# Patient Record
Sex: Female | Born: 1970 | Race: White | Hispanic: No | Marital: Married | Smoking: Never smoker
Health system: Southern US, Community
[De-identification: ages and names within clinical notes are randomized; demographics above are authoritative.]

## PROBLEM LIST (undated history)

## (undated) DIAGNOSIS — E7800 Pure hypercholesterolemia, unspecified: Secondary | ICD-10-CM

## (undated) DIAGNOSIS — E119 Type 2 diabetes mellitus without complications: Secondary | ICD-10-CM

## (undated) DIAGNOSIS — N959 Unspecified menopausal and perimenopausal disorder: Secondary | ICD-10-CM

## (undated) HISTORY — PX: ABDOMINAL HYSTERECTOMY: SHX81

## (undated) HISTORY — PX: TONSILLECTOMY: SUR1361

## (undated) HISTORY — PX: LEG TENDON SURGERY: SHX1004

## (undated) HISTORY — PX: ADENOIDECTOMY: SUR15

---

## 2019-12-10 DIAGNOSIS — R110 Nausea: Secondary | ICD-10-CM | POA: Diagnosis not present

## 2019-12-10 DIAGNOSIS — E119 Type 2 diabetes mellitus without complications: Secondary | ICD-10-CM | POA: Diagnosis not present

## 2019-12-10 DIAGNOSIS — E1165 Type 2 diabetes mellitus with hyperglycemia: Secondary | ICD-10-CM | POA: Diagnosis not present

## 2019-12-10 DIAGNOSIS — R519 Headache, unspecified: Secondary | ICD-10-CM | POA: Diagnosis not present

## 2019-12-10 DIAGNOSIS — Z6837 Body mass index (BMI) 37.0-37.9, adult: Secondary | ICD-10-CM | POA: Diagnosis not present

## 2019-12-25 DIAGNOSIS — E119 Type 2 diabetes mellitus without complications: Secondary | ICD-10-CM | POA: Diagnosis not present

## 2019-12-25 DIAGNOSIS — E1165 Type 2 diabetes mellitus with hyperglycemia: Secondary | ICD-10-CM | POA: Diagnosis not present

## 2019-12-25 DIAGNOSIS — Z794 Long term (current) use of insulin: Secondary | ICD-10-CM | POA: Diagnosis not present

## 2019-12-30 DIAGNOSIS — E1165 Type 2 diabetes mellitus with hyperglycemia: Secondary | ICD-10-CM | POA: Diagnosis not present

## 2019-12-30 DIAGNOSIS — E669 Obesity, unspecified: Secondary | ICD-10-CM | POA: Diagnosis not present

## 2019-12-30 DIAGNOSIS — E785 Hyperlipidemia, unspecified: Secondary | ICD-10-CM | POA: Diagnosis not present

## 2020-01-01 DIAGNOSIS — N83201 Unspecified ovarian cyst, right side: Secondary | ICD-10-CM | POA: Diagnosis not present

## 2020-01-01 DIAGNOSIS — Z794 Long term (current) use of insulin: Secondary | ICD-10-CM | POA: Diagnosis not present

## 2020-01-01 DIAGNOSIS — G4733 Obstructive sleep apnea (adult) (pediatric): Secondary | ICD-10-CM | POA: Diagnosis not present

## 2020-01-01 DIAGNOSIS — K859 Acute pancreatitis without necrosis or infection, unspecified: Secondary | ICD-10-CM | POA: Diagnosis not present

## 2020-01-01 DIAGNOSIS — K8020 Calculus of gallbladder without cholecystitis without obstruction: Secondary | ICD-10-CM | POA: Diagnosis not present

## 2020-01-01 DIAGNOSIS — E876 Hypokalemia: Secondary | ICD-10-CM | POA: Diagnosis not present

## 2020-01-01 DIAGNOSIS — J45909 Unspecified asthma, uncomplicated: Secondary | ICD-10-CM | POA: Diagnosis not present

## 2020-01-01 DIAGNOSIS — E119 Type 2 diabetes mellitus without complications: Secondary | ICD-10-CM | POA: Diagnosis not present

## 2020-01-01 DIAGNOSIS — Z20822 Contact with and (suspected) exposure to covid-19: Secondary | ICD-10-CM | POA: Diagnosis not present

## 2020-01-01 DIAGNOSIS — K5900 Constipation, unspecified: Secondary | ICD-10-CM | POA: Diagnosis not present

## 2020-01-01 DIAGNOSIS — Z6836 Body mass index (BMI) 36.0-36.9, adult: Secondary | ICD-10-CM | POA: Diagnosis not present

## 2020-01-01 DIAGNOSIS — E669 Obesity, unspecified: Secondary | ICD-10-CM | POA: Diagnosis not present

## 2020-01-01 DIAGNOSIS — N888 Other specified noninflammatory disorders of cervix uteri: Secondary | ICD-10-CM | POA: Diagnosis not present

## 2020-01-01 DIAGNOSIS — R1011 Right upper quadrant pain: Secondary | ICD-10-CM | POA: Diagnosis not present

## 2020-01-01 DIAGNOSIS — K654 Sclerosing mesenteritis: Secondary | ICD-10-CM | POA: Diagnosis not present

## 2020-01-01 DIAGNOSIS — Z87891 Personal history of nicotine dependence: Secondary | ICD-10-CM | POA: Diagnosis not present

## 2020-01-01 DIAGNOSIS — Z23 Encounter for immunization: Secondary | ICD-10-CM | POA: Diagnosis not present

## 2020-01-01 DIAGNOSIS — Z8249 Family history of ischemic heart disease and other diseases of the circulatory system: Secondary | ICD-10-CM | POA: Diagnosis not present

## 2020-01-02 DIAGNOSIS — E876 Hypokalemia: Secondary | ICD-10-CM | POA: Diagnosis not present

## 2020-01-02 DIAGNOSIS — I499 Cardiac arrhythmia, unspecified: Secondary | ICD-10-CM | POA: Diagnosis not present

## 2020-01-02 DIAGNOSIS — K8020 Calculus of gallbladder without cholecystitis without obstruction: Secondary | ICD-10-CM | POA: Diagnosis not present

## 2020-01-02 DIAGNOSIS — K859 Acute pancreatitis without necrosis or infection, unspecified: Secondary | ICD-10-CM | POA: Diagnosis not present

## 2020-01-02 DIAGNOSIS — E669 Obesity, unspecified: Secondary | ICD-10-CM | POA: Diagnosis not present

## 2020-01-02 DIAGNOSIS — E119 Type 2 diabetes mellitus without complications: Secondary | ICD-10-CM | POA: Diagnosis not present

## 2020-01-06 DIAGNOSIS — E876 Hypokalemia: Secondary | ICD-10-CM | POA: Diagnosis not present

## 2020-01-06 DIAGNOSIS — R748 Abnormal levels of other serum enzymes: Secondary | ICD-10-CM | POA: Diagnosis not present

## 2020-01-06 DIAGNOSIS — M793 Panniculitis, unspecified: Secondary | ICD-10-CM | POA: Diagnosis not present

## 2020-01-06 DIAGNOSIS — K824 Cholesterolosis of gallbladder: Secondary | ICD-10-CM | POA: Diagnosis not present

## 2020-01-14 DIAGNOSIS — E876 Hypokalemia: Secondary | ICD-10-CM | POA: Diagnosis not present

## 2020-01-14 DIAGNOSIS — E119 Type 2 diabetes mellitus without complications: Secondary | ICD-10-CM | POA: Diagnosis not present

## 2020-02-05 DIAGNOSIS — G4733 Obstructive sleep apnea (adult) (pediatric): Secondary | ICD-10-CM | POA: Diagnosis not present

## 2020-02-10 DIAGNOSIS — J4520 Mild intermittent asthma, uncomplicated: Secondary | ICD-10-CM | POA: Diagnosis not present

## 2020-02-10 DIAGNOSIS — J069 Acute upper respiratory infection, unspecified: Secondary | ICD-10-CM | POA: Diagnosis not present

## 2020-02-10 DIAGNOSIS — J3089 Other allergic rhinitis: Secondary | ICD-10-CM | POA: Diagnosis not present

## 2020-02-10 DIAGNOSIS — R051 Acute cough: Secondary | ICD-10-CM | POA: Diagnosis not present

## 2020-02-12 DIAGNOSIS — Z20822 Contact with and (suspected) exposure to covid-19: Secondary | ICD-10-CM | POA: Diagnosis not present

## 2020-02-12 DIAGNOSIS — J189 Pneumonia, unspecified organism: Secondary | ICD-10-CM | POA: Diagnosis not present

## 2020-02-12 DIAGNOSIS — R0602 Shortness of breath: Secondary | ICD-10-CM | POA: Diagnosis not present

## 2020-02-12 DIAGNOSIS — R059 Cough, unspecified: Secondary | ICD-10-CM | POA: Diagnosis not present

## 2020-03-14 DIAGNOSIS — R110 Nausea: Secondary | ICD-10-CM | POA: Diagnosis not present

## 2020-03-14 DIAGNOSIS — R059 Cough, unspecified: Secondary | ICD-10-CM | POA: Diagnosis not present

## 2020-03-14 DIAGNOSIS — U071 COVID-19: Secondary | ICD-10-CM | POA: Diagnosis not present

## 2020-03-16 DIAGNOSIS — J4521 Mild intermittent asthma with (acute) exacerbation: Secondary | ICD-10-CM | POA: Diagnosis not present

## 2020-03-16 DIAGNOSIS — J069 Acute upper respiratory infection, unspecified: Secondary | ICD-10-CM | POA: Diagnosis not present

## 2020-03-16 DIAGNOSIS — U071 COVID-19: Secondary | ICD-10-CM | POA: Diagnosis not present

## 2020-03-16 DIAGNOSIS — J208 Acute bronchitis due to other specified organisms: Secondary | ICD-10-CM | POA: Diagnosis not present

## 2020-03-17 DIAGNOSIS — G4730 Sleep apnea, unspecified: Secondary | ICD-10-CM | POA: Diagnosis not present

## 2020-03-17 DIAGNOSIS — U071 COVID-19: Secondary | ICD-10-CM | POA: Diagnosis not present

## 2020-03-18 DIAGNOSIS — J4521 Mild intermittent asthma with (acute) exacerbation: Secondary | ICD-10-CM | POA: Diagnosis not present

## 2020-03-18 DIAGNOSIS — J208 Acute bronchitis due to other specified organisms: Secondary | ICD-10-CM | POA: Diagnosis not present

## 2020-03-18 DIAGNOSIS — J069 Acute upper respiratory infection, unspecified: Secondary | ICD-10-CM | POA: Diagnosis not present

## 2020-03-18 DIAGNOSIS — U071 COVID-19: Secondary | ICD-10-CM | POA: Diagnosis not present

## 2020-03-20 DIAGNOSIS — J208 Acute bronchitis due to other specified organisms: Secondary | ICD-10-CM | POA: Diagnosis not present

## 2020-03-20 DIAGNOSIS — U071 COVID-19: Secondary | ICD-10-CM | POA: Diagnosis not present

## 2020-03-20 DIAGNOSIS — J4521 Mild intermittent asthma with (acute) exacerbation: Secondary | ICD-10-CM | POA: Diagnosis not present

## 2020-03-20 DIAGNOSIS — J069 Acute upper respiratory infection, unspecified: Secondary | ICD-10-CM | POA: Diagnosis not present

## 2020-03-23 DIAGNOSIS — E1165 Type 2 diabetes mellitus with hyperglycemia: Secondary | ICD-10-CM | POA: Diagnosis not present

## 2020-03-23 DIAGNOSIS — J4521 Mild intermittent asthma with (acute) exacerbation: Secondary | ICD-10-CM | POA: Diagnosis not present

## 2020-03-23 DIAGNOSIS — U071 COVID-19: Secondary | ICD-10-CM | POA: Diagnosis not present

## 2020-03-23 DIAGNOSIS — J208 Acute bronchitis due to other specified organisms: Secondary | ICD-10-CM | POA: Diagnosis not present

## 2020-03-24 DIAGNOSIS — R059 Cough, unspecified: Secondary | ICD-10-CM | POA: Diagnosis not present

## 2020-03-24 DIAGNOSIS — J1282 Pneumonia due to coronavirus disease 2019: Secondary | ICD-10-CM | POA: Diagnosis not present

## 2020-03-24 DIAGNOSIS — R918 Other nonspecific abnormal finding of lung field: Secondary | ICD-10-CM | POA: Diagnosis not present

## 2020-03-24 DIAGNOSIS — U071 COVID-19: Secondary | ICD-10-CM | POA: Diagnosis not present

## 2020-03-24 DIAGNOSIS — E119 Type 2 diabetes mellitus without complications: Secondary | ICD-10-CM | POA: Diagnosis not present

## 2020-03-24 DIAGNOSIS — R0602 Shortness of breath: Secondary | ICD-10-CM | POA: Diagnosis not present

## 2020-03-24 DIAGNOSIS — J45909 Unspecified asthma, uncomplicated: Secondary | ICD-10-CM | POA: Diagnosis not present

## 2020-03-24 DIAGNOSIS — Z794 Long term (current) use of insulin: Secondary | ICD-10-CM | POA: Diagnosis not present

## 2020-03-24 DIAGNOSIS — Z79899 Other long term (current) drug therapy: Secondary | ICD-10-CM | POA: Diagnosis not present

## 2020-03-24 DIAGNOSIS — Z87891 Personal history of nicotine dependence: Secondary | ICD-10-CM | POA: Diagnosis not present

## 2020-03-24 DIAGNOSIS — G4730 Sleep apnea, unspecified: Secondary | ICD-10-CM | POA: Diagnosis not present

## 2020-03-25 DIAGNOSIS — J1281 Pneumonia due to SARS-associated coronavirus: Secondary | ICD-10-CM | POA: Diagnosis not present

## 2020-03-25 DIAGNOSIS — J4521 Mild intermittent asthma with (acute) exacerbation: Secondary | ICD-10-CM | POA: Diagnosis not present

## 2020-03-25 DIAGNOSIS — J069 Acute upper respiratory infection, unspecified: Secondary | ICD-10-CM | POA: Diagnosis not present

## 2020-03-25 DIAGNOSIS — E1165 Type 2 diabetes mellitus with hyperglycemia: Secondary | ICD-10-CM | POA: Diagnosis not present

## 2020-03-26 DIAGNOSIS — J4521 Mild intermittent asthma with (acute) exacerbation: Secondary | ICD-10-CM | POA: Diagnosis not present

## 2020-03-26 DIAGNOSIS — E1165 Type 2 diabetes mellitus with hyperglycemia: Secondary | ICD-10-CM | POA: Diagnosis not present

## 2020-03-26 DIAGNOSIS — J1281 Pneumonia due to SARS-associated coronavirus: Secondary | ICD-10-CM | POA: Diagnosis not present

## 2020-03-26 DIAGNOSIS — J069 Acute upper respiratory infection, unspecified: Secondary | ICD-10-CM | POA: Diagnosis not present

## 2020-03-30 DIAGNOSIS — J019 Acute sinusitis, unspecified: Secondary | ICD-10-CM | POA: Diagnosis not present

## 2020-03-30 DIAGNOSIS — J4521 Mild intermittent asthma with (acute) exacerbation: Secondary | ICD-10-CM | POA: Diagnosis not present

## 2020-03-30 DIAGNOSIS — E1165 Type 2 diabetes mellitus with hyperglycemia: Secondary | ICD-10-CM | POA: Diagnosis not present

## 2020-03-30 DIAGNOSIS — J1281 Pneumonia due to SARS-associated coronavirus: Secondary | ICD-10-CM | POA: Diagnosis not present

## 2020-04-17 DIAGNOSIS — G4730 Sleep apnea, unspecified: Secondary | ICD-10-CM | POA: Diagnosis not present

## 2020-04-17 DIAGNOSIS — U071 COVID-19: Secondary | ICD-10-CM | POA: Diagnosis not present

## 2020-05-06 DIAGNOSIS — N952 Postmenopausal atrophic vaginitis: Secondary | ICD-10-CM | POA: Diagnosis not present

## 2020-05-06 DIAGNOSIS — N951 Menopausal and female climacteric states: Secondary | ICD-10-CM | POA: Diagnosis not present

## 2020-05-06 DIAGNOSIS — Z01411 Encounter for gynecological examination (general) (routine) with abnormal findings: Secondary | ICD-10-CM | POA: Diagnosis not present

## 2020-05-06 DIAGNOSIS — Z1231 Encounter for screening mammogram for malignant neoplasm of breast: Secondary | ICD-10-CM | POA: Diagnosis not present

## 2020-05-06 DIAGNOSIS — Z01419 Encounter for gynecological examination (general) (routine) without abnormal findings: Secondary | ICD-10-CM | POA: Diagnosis not present

## 2020-05-15 DIAGNOSIS — G4730 Sleep apnea, unspecified: Secondary | ICD-10-CM | POA: Diagnosis not present

## 2020-05-15 DIAGNOSIS — U071 COVID-19: Secondary | ICD-10-CM | POA: Diagnosis not present

## 2020-05-19 DIAGNOSIS — Z8616 Personal history of COVID-19: Secondary | ICD-10-CM | POA: Diagnosis not present

## 2020-05-19 DIAGNOSIS — E7849 Other hyperlipidemia: Secondary | ICD-10-CM | POA: Diagnosis not present

## 2020-05-19 DIAGNOSIS — E119 Type 2 diabetes mellitus without complications: Secondary | ICD-10-CM | POA: Diagnosis not present

## 2020-05-20 DIAGNOSIS — N9489 Other specified conditions associated with female genital organs and menstrual cycle: Secondary | ICD-10-CM | POA: Diagnosis not present

## 2020-05-25 DIAGNOSIS — Z01419 Encounter for gynecological examination (general) (routine) without abnormal findings: Secondary | ICD-10-CM | POA: Diagnosis not present

## 2020-05-25 DIAGNOSIS — Z1231 Encounter for screening mammogram for malignant neoplasm of breast: Secondary | ICD-10-CM | POA: Diagnosis not present

## 2020-05-26 DIAGNOSIS — E1165 Type 2 diabetes mellitus with hyperglycemia: Secondary | ICD-10-CM | POA: Diagnosis not present

## 2020-05-26 DIAGNOSIS — E669 Obesity, unspecified: Secondary | ICD-10-CM | POA: Diagnosis not present

## 2020-05-26 DIAGNOSIS — G4733 Obstructive sleep apnea (adult) (pediatric): Secondary | ICD-10-CM | POA: Diagnosis not present

## 2020-05-26 DIAGNOSIS — E785 Hyperlipidemia, unspecified: Secondary | ICD-10-CM | POA: Diagnosis not present

## 2020-06-01 DIAGNOSIS — L400 Psoriasis vulgaris: Secondary | ICD-10-CM | POA: Diagnosis not present

## 2020-06-03 DIAGNOSIS — L280 Lichen simplex chronicus: Secondary | ICD-10-CM | POA: Diagnosis not present

## 2020-06-10 DIAGNOSIS — R928 Other abnormal and inconclusive findings on diagnostic imaging of breast: Secondary | ICD-10-CM | POA: Diagnosis not present

## 2020-06-10 DIAGNOSIS — N6489 Other specified disorders of breast: Secondary | ICD-10-CM | POA: Diagnosis not present

## 2020-06-24 DIAGNOSIS — E119 Type 2 diabetes mellitus without complications: Secondary | ICD-10-CM | POA: Diagnosis not present

## 2020-06-24 DIAGNOSIS — Z794 Long term (current) use of insulin: Secondary | ICD-10-CM | POA: Diagnosis not present

## 2020-06-29 DIAGNOSIS — J4521 Mild intermittent asthma with (acute) exacerbation: Secondary | ICD-10-CM | POA: Diagnosis not present

## 2020-06-29 DIAGNOSIS — J209 Acute bronchitis, unspecified: Secondary | ICD-10-CM | POA: Diagnosis not present

## 2020-06-29 DIAGNOSIS — E119 Type 2 diabetes mellitus without complications: Secondary | ICD-10-CM | POA: Diagnosis not present

## 2020-06-29 DIAGNOSIS — J069 Acute upper respiratory infection, unspecified: Secondary | ICD-10-CM | POA: Diagnosis not present

## 2020-06-29 DIAGNOSIS — E7849 Other hyperlipidemia: Secondary | ICD-10-CM | POA: Diagnosis not present

## 2020-06-29 DIAGNOSIS — R0989 Other specified symptoms and signs involving the circulatory and respiratory systems: Secondary | ICD-10-CM | POA: Diagnosis not present

## 2020-07-21 DIAGNOSIS — M9902 Segmental and somatic dysfunction of thoracic region: Secondary | ICD-10-CM | POA: Diagnosis not present

## 2020-07-21 DIAGNOSIS — M542 Cervicalgia: Secondary | ICD-10-CM | POA: Diagnosis not present

## 2020-07-21 DIAGNOSIS — R600 Localized edema: Secondary | ICD-10-CM | POA: Diagnosis not present

## 2020-07-21 DIAGNOSIS — M9901 Segmental and somatic dysfunction of cervical region: Secondary | ICD-10-CM | POA: Diagnosis not present

## 2020-07-24 DIAGNOSIS — R600 Localized edema: Secondary | ICD-10-CM | POA: Diagnosis not present

## 2020-07-24 DIAGNOSIS — M9901 Segmental and somatic dysfunction of cervical region: Secondary | ICD-10-CM | POA: Diagnosis not present

## 2020-07-24 DIAGNOSIS — M9902 Segmental and somatic dysfunction of thoracic region: Secondary | ICD-10-CM | POA: Diagnosis not present

## 2020-07-24 DIAGNOSIS — M542 Cervicalgia: Secondary | ICD-10-CM | POA: Diagnosis not present

## 2020-07-27 DIAGNOSIS — M62412 Contracture of muscle, left shoulder: Secondary | ICD-10-CM | POA: Diagnosis not present

## 2020-07-27 DIAGNOSIS — M9901 Segmental and somatic dysfunction of cervical region: Secondary | ICD-10-CM | POA: Diagnosis not present

## 2020-07-27 DIAGNOSIS — M9902 Segmental and somatic dysfunction of thoracic region: Secondary | ICD-10-CM | POA: Diagnosis not present

## 2020-07-27 DIAGNOSIS — M542 Cervicalgia: Secondary | ICD-10-CM | POA: Diagnosis not present

## 2020-07-31 DIAGNOSIS — M9902 Segmental and somatic dysfunction of thoracic region: Secondary | ICD-10-CM | POA: Diagnosis not present

## 2020-07-31 DIAGNOSIS — M542 Cervicalgia: Secondary | ICD-10-CM | POA: Diagnosis not present

## 2020-07-31 DIAGNOSIS — M9901 Segmental and somatic dysfunction of cervical region: Secondary | ICD-10-CM | POA: Diagnosis not present

## 2020-07-31 DIAGNOSIS — M62412 Contracture of muscle, left shoulder: Secondary | ICD-10-CM | POA: Diagnosis not present

## 2020-08-04 DIAGNOSIS — M9901 Segmental and somatic dysfunction of cervical region: Secondary | ICD-10-CM | POA: Diagnosis not present

## 2020-08-04 DIAGNOSIS — M9902 Segmental and somatic dysfunction of thoracic region: Secondary | ICD-10-CM | POA: Diagnosis not present

## 2020-08-04 DIAGNOSIS — M62412 Contracture of muscle, left shoulder: Secondary | ICD-10-CM | POA: Diagnosis not present

## 2020-08-04 DIAGNOSIS — M542 Cervicalgia: Secondary | ICD-10-CM | POA: Diagnosis not present

## 2020-08-07 DIAGNOSIS — M542 Cervicalgia: Secondary | ICD-10-CM | POA: Diagnosis not present

## 2020-08-07 DIAGNOSIS — M9901 Segmental and somatic dysfunction of cervical region: Secondary | ICD-10-CM | POA: Diagnosis not present

## 2020-08-07 DIAGNOSIS — M9902 Segmental and somatic dysfunction of thoracic region: Secondary | ICD-10-CM | POA: Diagnosis not present

## 2020-08-07 DIAGNOSIS — M62412 Contracture of muscle, left shoulder: Secondary | ICD-10-CM | POA: Diagnosis not present

## 2020-08-10 DIAGNOSIS — M9901 Segmental and somatic dysfunction of cervical region: Secondary | ICD-10-CM | POA: Diagnosis not present

## 2020-08-10 DIAGNOSIS — M542 Cervicalgia: Secondary | ICD-10-CM | POA: Diagnosis not present

## 2020-08-10 DIAGNOSIS — M62412 Contracture of muscle, left shoulder: Secondary | ICD-10-CM | POA: Diagnosis not present

## 2020-08-10 DIAGNOSIS — M9902 Segmental and somatic dysfunction of thoracic region: Secondary | ICD-10-CM | POA: Diagnosis not present

## 2020-08-11 DIAGNOSIS — M542 Cervicalgia: Secondary | ICD-10-CM | POA: Diagnosis not present

## 2020-08-11 DIAGNOSIS — M62412 Contracture of muscle, left shoulder: Secondary | ICD-10-CM | POA: Diagnosis not present

## 2020-08-11 DIAGNOSIS — M9902 Segmental and somatic dysfunction of thoracic region: Secondary | ICD-10-CM | POA: Diagnosis not present

## 2020-08-11 DIAGNOSIS — M9901 Segmental and somatic dysfunction of cervical region: Secondary | ICD-10-CM | POA: Diagnosis not present

## 2020-08-11 DIAGNOSIS — J1281 Pneumonia due to SARS-associated coronavirus: Secondary | ICD-10-CM | POA: Diagnosis not present

## 2020-08-13 DIAGNOSIS — J4520 Mild intermittent asthma, uncomplicated: Secondary | ICD-10-CM | POA: Diagnosis not present

## 2020-08-13 DIAGNOSIS — J984 Other disorders of lung: Secondary | ICD-10-CM | POA: Diagnosis not present

## 2020-08-13 DIAGNOSIS — Z6835 Body mass index (BMI) 35.0-35.9, adult: Secondary | ICD-10-CM | POA: Diagnosis not present

## 2020-08-14 DIAGNOSIS — M9901 Segmental and somatic dysfunction of cervical region: Secondary | ICD-10-CM | POA: Diagnosis not present

## 2020-08-14 DIAGNOSIS — M62412 Contracture of muscle, left shoulder: Secondary | ICD-10-CM | POA: Diagnosis not present

## 2020-08-14 DIAGNOSIS — M542 Cervicalgia: Secondary | ICD-10-CM | POA: Diagnosis not present

## 2020-08-14 DIAGNOSIS — M9902 Segmental and somatic dysfunction of thoracic region: Secondary | ICD-10-CM | POA: Diagnosis not present

## 2020-08-17 DIAGNOSIS — M9902 Segmental and somatic dysfunction of thoracic region: Secondary | ICD-10-CM | POA: Diagnosis not present

## 2020-08-17 DIAGNOSIS — M62412 Contracture of muscle, left shoulder: Secondary | ICD-10-CM | POA: Diagnosis not present

## 2020-08-17 DIAGNOSIS — M542 Cervicalgia: Secondary | ICD-10-CM | POA: Diagnosis not present

## 2020-08-17 DIAGNOSIS — M9901 Segmental and somatic dysfunction of cervical region: Secondary | ICD-10-CM | POA: Diagnosis not present

## 2020-08-18 DIAGNOSIS — M542 Cervicalgia: Secondary | ICD-10-CM | POA: Diagnosis not present

## 2020-08-18 DIAGNOSIS — M9901 Segmental and somatic dysfunction of cervical region: Secondary | ICD-10-CM | POA: Diagnosis not present

## 2020-08-18 DIAGNOSIS — M62412 Contracture of muscle, left shoulder: Secondary | ICD-10-CM | POA: Diagnosis not present

## 2020-08-18 DIAGNOSIS — M9902 Segmental and somatic dysfunction of thoracic region: Secondary | ICD-10-CM | POA: Diagnosis not present

## 2020-08-21 DIAGNOSIS — M9901 Segmental and somatic dysfunction of cervical region: Secondary | ICD-10-CM | POA: Diagnosis not present

## 2020-08-21 DIAGNOSIS — K449 Diaphragmatic hernia without obstruction or gangrene: Secondary | ICD-10-CM | POA: Diagnosis not present

## 2020-08-21 DIAGNOSIS — M62412 Contracture of muscle, left shoulder: Secondary | ICD-10-CM | POA: Diagnosis not present

## 2020-08-21 DIAGNOSIS — J3089 Other allergic rhinitis: Secondary | ICD-10-CM | POA: Diagnosis not present

## 2020-08-21 DIAGNOSIS — J4520 Mild intermittent asthma, uncomplicated: Secondary | ICD-10-CM | POA: Diagnosis not present

## 2020-08-21 DIAGNOSIS — M9902 Segmental and somatic dysfunction of thoracic region: Secondary | ICD-10-CM | POA: Diagnosis not present

## 2020-08-21 DIAGNOSIS — F17200 Nicotine dependence, unspecified, uncomplicated: Secondary | ICD-10-CM | POA: Diagnosis not present

## 2020-08-21 DIAGNOSIS — J8410 Pulmonary fibrosis, unspecified: Secondary | ICD-10-CM | POA: Diagnosis not present

## 2020-08-21 DIAGNOSIS — H1131 Conjunctival hemorrhage, right eye: Secondary | ICD-10-CM | POA: Diagnosis not present

## 2020-08-21 DIAGNOSIS — M542 Cervicalgia: Secondary | ICD-10-CM | POA: Diagnosis not present

## 2020-09-01 DIAGNOSIS — G4733 Obstructive sleep apnea (adult) (pediatric): Secondary | ICD-10-CM | POA: Diagnosis not present

## 2020-09-01 DIAGNOSIS — E785 Hyperlipidemia, unspecified: Secondary | ICD-10-CM | POA: Diagnosis not present

## 2020-09-01 DIAGNOSIS — E1165 Type 2 diabetes mellitus with hyperglycemia: Secondary | ICD-10-CM | POA: Diagnosis not present

## 2020-09-01 DIAGNOSIS — E669 Obesity, unspecified: Secondary | ICD-10-CM | POA: Diagnosis not present

## 2020-09-21 DIAGNOSIS — E119 Type 2 diabetes mellitus without complications: Secondary | ICD-10-CM | POA: Diagnosis not present

## 2020-09-21 DIAGNOSIS — Z6836 Body mass index (BMI) 36.0-36.9, adult: Secondary | ICD-10-CM | POA: Diagnosis not present

## 2020-09-22 DIAGNOSIS — Z794 Long term (current) use of insulin: Secondary | ICD-10-CM | POA: Diagnosis not present

## 2020-09-22 DIAGNOSIS — E119 Type 2 diabetes mellitus without complications: Secondary | ICD-10-CM | POA: Diagnosis not present

## 2020-09-30 DIAGNOSIS — E119 Type 2 diabetes mellitus without complications: Secondary | ICD-10-CM | POA: Diagnosis not present

## 2020-09-30 DIAGNOSIS — E7849 Other hyperlipidemia: Secondary | ICD-10-CM | POA: Diagnosis not present

## 2020-09-30 DIAGNOSIS — Z6835 Body mass index (BMI) 35.0-35.9, adult: Secondary | ICD-10-CM | POA: Diagnosis not present

## 2020-12-23 DIAGNOSIS — Z794 Long term (current) use of insulin: Secondary | ICD-10-CM | POA: Diagnosis not present

## 2020-12-23 DIAGNOSIS — E119 Type 2 diabetes mellitus without complications: Secondary | ICD-10-CM | POA: Diagnosis not present

## 2021-01-05 DIAGNOSIS — N898 Other specified noninflammatory disorders of vagina: Secondary | ICD-10-CM | POA: Diagnosis not present

## 2021-01-05 DIAGNOSIS — F419 Anxiety disorder, unspecified: Secondary | ICD-10-CM | POA: Diagnosis not present

## 2021-01-05 DIAGNOSIS — N951 Menopausal and female climacteric states: Secondary | ICD-10-CM | POA: Diagnosis not present

## 2021-01-05 DIAGNOSIS — Z79890 Hormone replacement therapy (postmenopausal): Secondary | ICD-10-CM | POA: Diagnosis not present

## 2021-01-05 DIAGNOSIS — L280 Lichen simplex chronicus: Secondary | ICD-10-CM | POA: Diagnosis not present

## 2021-01-05 DIAGNOSIS — Z23 Encounter for immunization: Secondary | ICD-10-CM | POA: Diagnosis not present

## 2021-01-05 DIAGNOSIS — R300 Dysuria: Secondary | ICD-10-CM | POA: Diagnosis not present

## 2021-01-26 DIAGNOSIS — G4733 Obstructive sleep apnea (adult) (pediatric): Secondary | ICD-10-CM | POA: Diagnosis not present

## 2021-02-16 ENCOUNTER — Ambulatory Visit (INDEPENDENT_AMBULATORY_CARE_PROVIDER_SITE_OTHER): Payer: BC Managed Care – PPO

## 2021-02-16 ENCOUNTER — Other Ambulatory Visit: Payer: Self-pay

## 2021-02-16 ENCOUNTER — Ambulatory Visit
Admission: EM | Admit: 2021-02-16 | Discharge: 2021-02-16 | Disposition: A | Discharge status: Home / Self Care | Payer: BC Managed Care – PPO

## 2021-02-16 DIAGNOSIS — J0100 Acute maxillary sinusitis, unspecified: Secondary | ICD-10-CM

## 2021-02-16 DIAGNOSIS — R0989 Other specified symptoms and signs involving the circulatory and respiratory systems: Secondary | ICD-10-CM | POA: Diagnosis not present

## 2021-02-16 DIAGNOSIS — R059 Cough, unspecified: Secondary | ICD-10-CM | POA: Diagnosis not present

## 2021-02-16 DIAGNOSIS — J40 Bronchitis, not specified as acute or chronic: Secondary | ICD-10-CM

## 2021-02-16 DIAGNOSIS — R062 Wheezing: Secondary | ICD-10-CM | POA: Diagnosis not present

## 2021-02-16 HISTORY — DX: Type 2 diabetes mellitus without complications: E11.9

## 2021-02-16 HISTORY — DX: Unspecified menopausal and perimenopausal disorder: N95.9

## 2021-02-16 HISTORY — DX: Pure hypercholesterolemia, unspecified: E78.00

## 2021-02-16 MED ORDER — BENZONATATE 100 MG PO CAPS: 200.0000 mg | ORAL_CAPSULE | Freq: Three times a day (TID) | ORAL | 0 refills | Status: DC

## 2021-02-16 MED ORDER — PREDNISONE 20 MG PO TABS: 60.0000 mg | ORAL_TABLET | Freq: Every day | ORAL | 0 refills | Status: AC

## 2021-02-16 MED ORDER — GUAIFENESIN-CODEINE 100-10 MG/5ML PO SYRP: 5.0000 mL | ORAL_SOLUTION | Freq: Three times a day (TID) | ORAL | 0 refills | Status: DC | PRN

## 2021-02-16 MED ORDER — DOXYCYCLINE HYCLATE 100 MG PO CAPS: 100.0000 mg | ORAL_CAPSULE | Freq: Two times a day (BID) | ORAL | 0 refills | Status: DC

## 2021-02-16 NOTE — Discharge Instructions (Addendum)
The Doxycycline twice daily with food for 10 days for treatment of your sinusitis.  Perform sinus irrigation 2-3 times a day with a NeilMed sinus rinse kit and distilled water.  Do not use tap water.  You can use plain over-the-counter Mucinex every 6 hours to break up the stickiness of the mucus so your body can clear it.  Increase your oral fluid intake to thin out your mucus so that is also able for your body to clear more easily.  Take an over-the-counter probiotic, such as Culturelle-align-activia, 1 hour after each dose of antibiotic to prevent diarrhea.  Use the albuterol inhaler/nebulizer every 4-6 hours as needed for shortness of breath, wheezing, and cough.  Take the prednisone according to the package instructions to help with pulmonary inflammation.  Use the Tessalon Perles every 8 hours for your cough.  Taken with a small sip of water.  They may give you some numbness to the base of your tongue or metallic taste in her mouth, this is normal.  They are designed to calm down the cough reflex.  Use the Cheratussin cough syrup at bedtime as will make you drowsy.  You may take 1 teaspoon (5 mL) every 6 hours.   If you develop any new or worsening symptoms return for reevaluation or see your primary care provider.

## 2021-02-16 NOTE — ED Provider Notes (Signed)
[facility]    CSN: [id number] Arrival date & time: 02/16/21  5852      History   Chief Complaint Chief Complaint  Patient presents with   Cough   Nasal Congestion    HPI Tonya Gross is a 50 y.o. female.   HPI  50 year old female here for evaluation of respiratory symptoms.  Patient ports that she was diagnosed with influenza on 1129.  She had telehealth appointment on 12 5 and was prescribed cough medication and prednisone but she reports that she is still continuing to cough, have nasal congestions, wheezing and crackles when she breathes.  She states that she had a fever on the first 2 days of illness but has not had any subsequent fevers.  She is experiencing green nasal discharge and pressure in her sinuses.  She is producing colored sputum with streaks of blood when she coughs.  Every evening she endorses a coughing fit which results in posttussive emesis prior to bed.  She is also had occasional shortness of breath.  She is still experiencing sore throat and ear fullness.  Until yesterday she was experiencing diarrhea and now she is endorsing constipation.  She denies any nausea or vomiting.  Past Medical History:  Diagnosis Date   High cholesterol    Menopausal problem    Type 2 diabetes mellitus ([place])     There are no problems to display for this patient.   Past Surgical History:  Procedure Laterality Date   ABDOMINAL HYSTERECTOMY     ADENOIDECTOMY     LEG TENDON SURGERY     TONSILLECTOMY      OB History   No obstetric history on file.      Home Medications    Prior to Admission medications   Medication Sig Start Date End Date Taking? Authorizing Provider  benzonatate (TESSALON) 100 MG capsule Take 2 capsules (200 mg total) by mouth every 8 (eight) hours. 02/16/21  Yes Margarette Canada, NP  doxycycline (VIBRAMYCIN) 100 MG capsule Take 1 capsule (100 mg total) by mouth 2 (two) times daily. 02/16/21  Yes Margarette Canada, NP  estradiol (ESTRACE)  2 MG tablet Take 1 tablet by mouth daily. 01/05/21  Yes [provider]  guaiFENesin-codeine (ROBITUSSIN AC) 100-10 MG/5ML syrup Take 5 mLs by mouth 3 (three) times daily as needed for cough. 02/16/21  Yes Margarette Canada, NP  predniSONE (DELTASONE) 20 MG tablet Take 3 tablets (60 mg total) by mouth daily with breakfast for 5 days. 3 tablets daily for 5 days. 02/16/21 02/21/21 Yes Margarette Canada, NP  albuterol (VENTOLIN HFA) 108 (90 Base) MCG/ACT inhaler Inhale into the lungs.    [provider]  EPINEPHrine 0.3 mg/0.3 mL IJ SOAJ injection Inject into the muscle as directed. 08/21/20   [provider]  insulin aspart (NOVOLOG) 100 UNIT/ML injection Inject into the skin.    [provider]  OZEMPIC, 2 MG/DOSE, 8 MG/3ML SOPN Inject 2 mg into the skin once a week. 12/23/20   [provider]  PARoxetine (PAXIL) 20 MG tablet Take 20 mg by mouth daily. 01/06/21   [provider]  rosuvastatin (CRESTOR) 20 MG tablet Take 20 mg by mouth daily. 12/24/20   [provider]    Family History History reviewed. No pertinent family history.  Social History Social History   Tobacco Use   Smoking status: Never   Smokeless tobacco: Never  Vaping Use   Vaping Use: Never used  Substance Use Topics   Alcohol use:  Yes    Comment: social drinker   Drug use: Never     Allergies   Amoxicillin-pot clavulanate, Bee venom, Ciprofloxacin, and Ciprofloxacin hcl   Review of Systems Review of Systems  Constitutional:  Positive for fatigue and fever. Negative for activity change and appetite change.  HENT:  Positive for congestion, ear pain, rhinorrhea, sinus pressure and sore throat.   Respiratory:  Positive for cough, shortness of breath and wheezing.   Gastrointestinal:  Positive for diarrhea. Negative for abdominal pain, nausea and vomiting.  Skin:  Negative for rash.  Hematological: Negative.   Psychiatric/Behavioral: Negative.      Physical  Exam Triage Vital Signs ED Triage Vitals  Enc Vitals Group     BP 02/16/21 0917 117/87     Pulse Rate 02/16/21 0917 89     Resp 02/16/21 0917 16     Temp 02/16/21 0917 98.6 F (37 C)     Temp Source 02/16/21 0917 Oral     SpO2 02/16/21 0917 99 %     Weight 02/16/21 0915 217 lb (98.4 kg)     Height --      Head Circumference --      Peak Flow --      Pain Score 02/16/21 0909 0     Pain Loc --      Pain Edu? --      Excl. in [place]? --    No data found.  Updated Vital Signs BP 117/87 (BP Location: Right Arm)    Pulse 89    Temp 98.6 F (37 C) (Oral)    Resp 16    Wt 217 lb (98.4 kg)    SpO2 99%   Visual Acuity Right Eye Distance:   Left Eye Distance:   Bilateral Distance:    Right Eye Near:   Left Eye Near:    Bilateral Near:     Physical Exam Vitals and nursing note reviewed.  Constitutional:      General: She is not in acute distress.    Appearance: Normal appearance. She is not ill-appearing.  HENT:     Head: Normocephalic and atraumatic.     Right Ear: Tympanic membrane, ear canal and external ear normal. There is no impacted cerumen.     Left Ear: Tympanic membrane, ear canal and external ear normal. There is no impacted cerumen.     Nose: Congestion and rhinorrhea present.     Comments: Patient does have tenderness to percussion of left maxillary sinus.    Mouth/Throat:     Mouth: Mucous membranes are moist.     Pharynx: Oropharynx is clear. Posterior oropharyngeal erythema present.  Cardiovascular:     Rate and Rhythm: Normal rate and regular rhythm.     Pulses: Normal pulses.     Heart sounds: Normal heart sounds. No murmur heard.   No gallop.  Pulmonary:     Effort: Pulmonary effort is normal.     Breath sounds: Wheezing present. No rhonchi or rales.     Comments: Patient does have wheezing with coughing but not through her normal respiratory cycle. Musculoskeletal:     Cervical back: Normal range of motion and neck supple.  Lymphadenopathy:      Cervical: No cervical adenopathy.  Skin:    General: Skin is warm and dry.     Capillary Refill: Capillary refill takes less than 2 seconds.     Findings: No erythema or rash.  Neurological:     General: No focal deficit  present.     Mental Status: She is alert and oriented to person, place, and time.  Psychiatric:        Mood and Affect: Mood normal.        Behavior: Behavior normal.        Thought Content: Thought content normal.        Judgment: Judgment normal.     UC Treatments / Results  Labs (all labs ordered are listed, but only abnormal results are displayed) Labs Reviewed - No data to display  EKG   Radiology DG Chest 2 View  Result Date: 02/16/2021 CLINICAL DATA:  Cough for 2 weeks, nasal congestion, wheezing, crackles EXAM: CHEST - 2 VIEW COMPARISON:  None FINDINGS: Upper normal heart size. Mediastinal contours and pulmonary vascularity normal. Lungs clear. No pulmonary infiltrate, pleural effusion, or pneumothorax. Osseous structures unremarkable. IMPRESSION: No acute abnormalities. Electronically Signed   By: Lavonia Dana M.D.   On: 02/16/2021 10:07    Procedures Procedures (including critical care time)  Medications Ordered in UC Medications - No data to display  Initial Impression / Assessment and Plan / UC Course  I have reviewed the triage vital signs and the nursing notes.  Pertinent labs & imaging results that were available during my care of the patient were reviewed by me and considered in my medical decision making (see chart for details).  Patient is a nontoxic-appearing 50 year old female here for evaluation of continued respiratory symptoms that have been present for 14 days since being diagnosed with the flu on 02/02/2021.  She has been treated with cough medicine and prednisone without any resolution of symptoms.  She states that she is coughing so much that now she is bringing up colored mucus with streaks of blood in it.  On physical exam patient  has pearly gray tympanic membranes bilaterally with a normal light reflex and clear external auditory canals.  Nasal mucosa is erythematous and mildly edematous with clear discharge in both nares.  The left maxillary sinus is tender to percussion.  Oropharyngeal exam reveals mild posterior oropharyngeal erythema with clear postnasal drip.  No injection noted.  Patient's tonsils are surgically absent.  No cervical lymphadenopathy appreciated on exam.  Cardiopulmonary exam reveals clear lung sounds through out all lung fields.  Deep breathing does trigger a cough and when patient coughs she does have expiratory wheezing.  Due to the protracted duration of symptoms and purulent sputum production and can obtain a chest x-ray to rule out the presence of pneumonia.  Chest x-ray independently reviewed and evaluated by me.  Impression: The lung spaces are well pneumatized.  There is no evidence of effusion or infiltrate on exam.  Radiology overread is pending. Radiology impression is no acute abnormalities.  Patient's exam is consistent with maxillary sinusitis and also bronchitis.  I will treat her with doxycycline twice daily for 10 days as she has allergies to fluoroquinolones as well as Augmentin.  Both cause hives so I am reluctant to prescribe a cephalosporin.  I will also prescribe another round of prednisone, Tessalon Perles and Cheratussin cough syrup as patient has been taking Promethazine DM without any relief of symptoms.  Patient is prescribed albuterol inhaler which I will encourage her to continue using for wheezing.   Final Clinical Impressions(s) / UC Diagnoses   Final diagnoses:  Acute non-recurrent maxillary sinusitis  Bronchitis     Discharge Instructions      The Doxycycline twice daily with food for 10 days for treatment of your  sinusitis.  Perform sinus irrigation 2-3 times a day with a NeilMed sinus rinse kit and distilled water.  Do not use tap water.  You can use plain  over-the-counter Mucinex every 6 hours to break up the stickiness of the mucus so your body can clear it.  Increase your oral fluid intake to thin out your mucus so that is also able for your body to clear more easily.  Take an over-the-counter probiotic, such as Culturelle-align-activia, 1 hour after each dose of antibiotic to prevent diarrhea.  Use the albuterol inhaler/nebulizer every 4-6 hours as needed for shortness of breath, wheezing, and cough.  Take the prednisone according to the package instructions to help with pulmonary inflammation.  Use the Tessalon Perles every 8 hours for your cough.  Taken with a small sip of water.  They may give you some numbness to the base of your tongue or metallic taste in her mouth, this is normal.  They are designed to calm down the cough reflex.  Use the Cheratussin cough syrup at bedtime as will make you drowsy.  You may take 1 teaspoon (5 mL) every 6 hours.   If you develop any new or worsening symptoms return for reevaluation or see your primary care provider.      ED Prescriptions     Medication Sig Dispense Auth. Provider   doxycycline (VIBRAMYCIN) 100 MG capsule Take 1 capsule (100 mg total) by mouth 2 (two) times daily. 20 capsule Margarette Canada, NP   predniSONE (DELTASONE) 20 MG tablet Take 3 tablets (60 mg total) by mouth daily with breakfast for 5 days. 3 tablets daily for 5 days. 15 tablet Margarette Canada, NP   benzonatate (TESSALON) 100 MG capsule Take 2 capsules (200 mg total) by mouth every 8 (eight) hours. 21 capsule Margarette Canada, NP   guaiFENesin-codeine (ROBITUSSIN AC) 100-10 MG/5ML syrup Take 5 mLs by mouth 3 (three) times daily as needed for cough. 120 mL Margarette Canada, NP      I have reviewed the PDMP during this encounter.   Margarette Canada, NP 02/16/21 1019

## 2021-02-16 NOTE — ED Triage Notes (Signed)
Patient presents to Urgent Care with complaints of cough, nasal congestion, wheezing/crackles. She had telehealth appt on 12/05. Prescribed the cough med and prednisone. Pt states she had the flu 11/29. Treating symptoms with neb treatments, promethazine, mucinex. Had two negative covid tests.

## 2021-05-05 DIAGNOSIS — E119 Type 2 diabetes mellitus without complications: Secondary | ICD-10-CM | POA: Diagnosis not present

## 2021-05-19 DIAGNOSIS — G4733 Obstructive sleep apnea (adult) (pediatric): Secondary | ICD-10-CM | POA: Diagnosis not present

## 2021-06-08 DIAGNOSIS — H25813 Combined forms of age-related cataract, bilateral: Secondary | ICD-10-CM | POA: Diagnosis not present

## 2021-06-08 DIAGNOSIS — E119 Type 2 diabetes mellitus without complications: Secondary | ICD-10-CM | POA: Diagnosis not present

## 2021-06-22 DIAGNOSIS — H25812 Combined forms of age-related cataract, left eye: Secondary | ICD-10-CM | POA: Diagnosis not present

## 2021-06-22 DIAGNOSIS — Z01818 Encounter for other preprocedural examination: Secondary | ICD-10-CM | POA: Diagnosis not present

## 2021-07-13 DIAGNOSIS — Z1239 Encounter for other screening for malignant neoplasm of breast: Secondary | ICD-10-CM | POA: Diagnosis not present

## 2021-07-13 DIAGNOSIS — Z1231 Encounter for screening mammogram for malignant neoplasm of breast: Secondary | ICD-10-CM | POA: Diagnosis not present

## 2021-07-20 DIAGNOSIS — E119 Type 2 diabetes mellitus without complications: Secondary | ICD-10-CM | POA: Diagnosis not present

## 2021-07-20 DIAGNOSIS — E7849 Other hyperlipidemia: Secondary | ICD-10-CM | POA: Diagnosis not present

## 2021-07-20 DIAGNOSIS — D72819 Decreased white blood cell count, unspecified: Secondary | ICD-10-CM | POA: Diagnosis not present

## 2021-07-21 ENCOUNTER — Ambulatory Visit (INDEPENDENT_AMBULATORY_CARE_PROVIDER_SITE_OTHER): Payer: BC Managed Care – PPO | Admitting: Internal Medicine

## 2021-07-21 ENCOUNTER — Ambulatory Visit: Payer: BC Managed Care – PPO | Admitting: Internal Medicine

## 2021-07-21 ENCOUNTER — Encounter: Payer: Self-pay | Admitting: Internal Medicine

## 2021-07-21 VITALS — BP 99/65 | HR 87 | Ht 67.0 in | Wt 196.0 lb

## 2021-07-21 DIAGNOSIS — Z794 Long term (current) use of insulin: Secondary | ICD-10-CM

## 2021-07-21 DIAGNOSIS — R739 Hyperglycemia, unspecified: Secondary | ICD-10-CM

## 2021-07-21 DIAGNOSIS — E119 Type 2 diabetes mellitus without complications: Secondary | ICD-10-CM

## 2021-07-21 LAB — POCT GLYCOSYLATED HEMOGLOBIN (HGB A1C): Hemoglobin A1C: 6.5 % — AB (ref 4.0–5.6)

## 2021-07-21 MED ORDER — DEXCOM G7 SENSOR MISC: 1.0000 | 3 refills | Status: DC

## 2021-07-21 MED ORDER — CONTOUR NEXT TEST VI STRP: 1.0000 | ORAL_STRIP | Freq: Four times a day (QID) | 3 refills | Status: DC

## 2021-07-21 NOTE — Progress Notes (Signed)
Name: Tonya Gross  MRN/ DOB: [id number], 03-Mar-1971   Age/ Sex: 51 y.o., female    PCP: Wayland Denis, PA-C   Reason for Endocrinology Evaluation: Type 2 Diabetes Mellitus     Date of Initial Endocrinology Visit: 07/21/2021     PATIENT IDENTIFIER: Tonya Gross is a 51 y.o. female with a past medical history of DM, HTN, Dyslipidemia, OSA. The patient presented for initial endocrinology clinic visit on 07/21/2021 for consultative assistance with Tonya Gross diabetes management.    HPI: Tonya Gross was    Moved from [place]  , [place]   Diagnosed with DM 2007 Prior Medications tried/Intolerance: Ozempic -no intolerance , Metformin- intolerance .Marland Kitchen Has been on the pump many years ( Medtronic )  Currently checking blood sugars 3 x / day Hypoglycemia episodes : yes               Symptoms: yes                 Frequency: rare Hemoglobin A1c has ranged from 6.5% in 2023, peaking at 11.0%  yrs ago  Patient has required hospitalization within the last 1 year from hyper or hypoglycemia: no  In terms of diet, the patient eats 3 meals a day, does not always eat of CHO ( [employer] diet) she has been on it for 3 months   Pending cataract sx   Father with DM  NO Fh of thyroid disease      This patient with type 1 diabetes is treated with medtronic (insulin pump). During the visit the pump basal and bolus doses were reviewed including carb/insulin rations and supplemental doses. The clinical list was updated. The glucose meter download was reviewed in detail to determine if the current pump settings are providing the best glycemic control without excessive hypoglycemia.  Pump and meter download:    Pump   Medtronic  Settings   Insulin type   Novolog    Basal rate       0000  1.15 u/h    0300 1.20 u/h    0700 1.20 u/h    1000 1.05   1500 1.15      I:C ratio       0000  11   1100 11   1600 11          Sensitivity       0000  50      Goal       0000  90-120             Type & Model of Pump: Medtronic  Insulin Type: Currently using Novolog.  Body mass index is 30.7 kg/m.  PUMP STATISTICS: Average BG: 127 +/- 44 Average Daily Carbs (g): 8 +/- 16 Average Total Daily Insulin: 26 +/- 3.8 Average Daily Basal: 24.9 (96 %) Average Daily Bolus: 1.1 (4 %)      HOME DIABETES REGIMEN: Novolog    Statin: yes  ACE-I/ARB: no     METER DOWNLOAD SUMMARY:   DIABETIC COMPLICATIONS: Microvascular complications:   Denies: CKD, neuropathy, retinopathy  Last eye exam: Completed 06/2021  Macrovascular complications:  Denies: CAD, PVD, CVA   PAST HISTORY: Past Medical History:  Past Medical History:  Diagnosis Date   High cholesterol    Menopausal problem    Type 2 diabetes mellitus ([place])    Past Surgical History:  Past Surgical History:  Procedure Laterality Date   ABDOMINAL HYSTERECTOMY     ADENOIDECTOMY     LEG TENDON  SURGERY     TONSILLECTOMY      Social History:  reports that she has never smoked. She has never used smokeless tobacco. She reports current alcohol use. She reports that she does not use drugs. Family History: No family history on file.   HOME MEDICATIONS: Allergies as of 07/21/2021       Reactions   Amoxicillin-pot Clavulanate Hives   Bee Venom Anaphylaxis   Ciprofloxacin Hives   Ciprofloxacin Hcl Hives   Silicone Other (See Comments)   Peeling        Medication List        Accurate as of Jul 21, 2021  2:19 PM. If you have any questions, ask your nurse or doctor.          STOP taking these medications    benzonatate 100 MG capsule Commonly known as: TESSALON Stopped by: Dorita Sciara, MD   doxycycline 100 MG capsule Commonly known as: VIBRAMYCIN Stopped by: Dorita Sciara, MD   estradiol 2 MG tablet Commonly known as: ESTRACE Stopped by: Dorita Sciara, MD   guaiFENesin-codeine 100-10 MG/5ML syrup Commonly known as: ROBITUSSIN AC Stopped by: Dorita Sciara, MD   PARoxetine 20 MG tablet Commonly known as: PAXIL Stopped by: Dorita Sciara, MD       TAKE these medications    albuterol 108 (90 Base) MCG/ACT inhaler Commonly known as: VENTOLIN HFA Inhale into the lungs.   EPINEPHrine 0.3 mg/0.3 mL Soaj injection Commonly known as: EPI-PEN Inject into the muscle as directed.   insulin aspart 100 UNIT/ML injection Commonly known as: novoLOG Inject into the skin.   NON FORMULARY Blueprint metabolic H3   NON FORMULARY BLUEPRINT PROTEIN BB   NON FORMULARY BLUEPRINT CORTISOL B   NON FORMULARY BLUEPRINT SLIM DIETARY SUPPLEMENT   NON FORMULARY BLUEPRINT CLEANSE   Ozempic (2 MG/DOSE) 8 MG/3ML Sopn Generic drug: Semaglutide (2 MG/DOSE) Inject 2 mg into the skin once a week.   rosuvastatin 20 MG tablet Commonly known as: CRESTOR Take 20 mg by mouth daily.         ALLERGIES: Allergies  Allergen Reactions   Amoxicillin-Pot Clavulanate Hives   Bee Venom Anaphylaxis   Ciprofloxacin Hives   Ciprofloxacin Hcl Hives   Silicone Other (See Comments)    Peeling     REVIEW OF SYSTEMS: A comprehensive ROS was conducted with the patient and is negative except as per HPI and below:  Review of Systems  Gastrointestinal:  Negative for abdominal pain, diarrhea and nausea.     OBJECTIVE:   VITAL SIGNS: BP 99/65 (BP Location: Left Arm, Patient Position: Sitting, Cuff Size: Large)   Pulse 87   Ht 5\' 7"  (1.702 m)   Wt 196 lb (88.9 kg)   SpO2 99%   BMI 30.70 kg/m    PHYSICAL EXAM:  General: Pt appears well and is in NAD  Neck: General: Supple without adenopathy or carotid bruits. Thyroid: Thyroid size normal.  No goiter or nodules appreciated.  Lungs: Clear with good BS bilat with no rales, rhonchi, or wheezes  Heart: RRR with normal S1 and S2 and no gallops; no murmurs; no rub  Abdomen: Normoactive bowel sounds, soft, nontender, without masses or organomegaly palpable  Extremities:  Lower extremities -  No pretibial edema. No lesions.  Neuro: MS is good with appropriate affect, pt is alert and Ox3    DM foot exam: 07/21/2021  The skin of the feet is intact without sores or ulcerations. The pedal  pulses are 2+ on right and 2+ on left. The sensation is intact to a screening 5.07, 10 gram monofilament bilaterally   DATA REVIEWED:  07/20/2021 BUN/Cr 21/0.79 GFR 91 Na 137 K 4.4 AST 89 (0-40) ALT 92 (0-32) Tg 214 LDL 138 HDL 25    ASSESSMENT / PLAN / RECOMMENDATIONS:   1) Type 2 Diabetes Mellitus, Optimally controlled, Without  complications - Most recent A1c of 6.5 %. Goal A1c < 7.0 %.    - Tonya Gross A1c is at goal -Intolerant to metformin -She has been on Ozempic in the past but developed hypoglycemia, we discussed benefits of Ozempic, I did explain to the patient that Ozempic improves insulin resistance hence hypoglycemia and I would recommend reducing Tonya Gross insulin regimen while restarting on Ozempic, she has some at home and is willing to give this another try  -I have changed Tonya Gross basal rate as below and adjusted Tonya Gross sensitivity factor  -We will fax Dexcom prescription to [facility] health care Pump   Medtronic  Settings   Insulin type   Novolog    Basal rate       0000  1.00  u/h    0300 1.00 u/h    0700 1.00  u/h    1000 0.90   1500 1.00      I:C ratio       0000  11   1100 11   1600 11          Sensitivity       0000  40      Goal       0000  90-120       MEDICATIONS: NovoLog  EDUCATION / INSTRUCTIONS: BG monitoring instructions: Patient is instructed to check Tonya Gross blood sugars 3 times a day, before meals. Call [facility] if: BG persistently < 70  I reviewed the Rule of 15 for the treatment of hypoglycemia in detail with the patient. Literature supplied.   2) Diabetic complications:  Eye: Does not have known diabetic retinopathy.  Neuro/ Feet: Does not have known diabetic peripheral neuropathy. Renal: Patient does not have known baseline  CKD. She is not on an ACEI/ARB at present.     Signed electronically by: Mack Guise, MD  [facility]  [facility] [address], [place], [place] [address] Phone: [phone] FAX: [phone]   CC: Wayland Denis, PA-C [place] [address] Phone: [phone]  Fax: [phone]    Return to Endocrinology clinic as below: Future Appointments  Date Time Provider [place]  07/21/2021  2:20 PM Ernesha Ramone, Melanie Crazier, MD [facility] None

## 2021-07-21 NOTE — Patient Instructions (Signed)
Restart Ozempic 2 mg weekly  ? ? ? ?Pump   Medtronic  Settings   ?Insulin type   Novolog    ?Basal rate      ? 0000  1.00  u/h   ? 0300 1.00 u/h   ? 0700 1.00  u/h   ? 1000 0.90  ? 1500 1.00  ?    ?I:C ratio      ? 0000  11  ? 1100 11  ? 1600 11  ?    ?    ?Sensitivity      ? 0000  40  ?    ?Goal      ? 0000  90-120  ?  ?  ? ?

## 2021-07-22 ENCOUNTER — Encounter: Payer: Self-pay | Admitting: Internal Medicine

## 2021-07-23 DIAGNOSIS — E119 Type 2 diabetes mellitus without complications: Secondary | ICD-10-CM | POA: Insufficient documentation

## 2021-07-23 MED ORDER — OZEMPIC (2 MG/DOSE) 8 MG/3ML ~~LOC~~ SOPN: 2.0000 mg | PEN_INJECTOR | SUBCUTANEOUS | 1 refills | Status: DC

## 2021-07-23 MED ORDER — INSULIN ASPART 100 UNIT/ML IJ SOLN
INTRAMUSCULAR | 3 refills | Status: DC
Start: 2021-07-23 — End: 2021-09-08

## 2021-07-26 DIAGNOSIS — R945 Abnormal results of liver function studies: Secondary | ICD-10-CM | POA: Diagnosis not present

## 2021-07-26 DIAGNOSIS — D696 Thrombocytopenia, unspecified: Secondary | ICD-10-CM | POA: Diagnosis not present

## 2021-07-26 DIAGNOSIS — D72819 Decreased white blood cell count, unspecified: Secondary | ICD-10-CM | POA: Diagnosis not present

## 2021-07-26 DIAGNOSIS — E7849 Other hyperlipidemia: Secondary | ICD-10-CM | POA: Diagnosis not present

## 2021-07-29 DIAGNOSIS — H25812 Combined forms of age-related cataract, left eye: Secondary | ICD-10-CM | POA: Diagnosis not present

## 2021-07-29 DIAGNOSIS — H2512 Age-related nuclear cataract, left eye: Secondary | ICD-10-CM | POA: Diagnosis not present

## 2021-07-29 DIAGNOSIS — H52223 Regular astigmatism, bilateral: Secondary | ICD-10-CM | POA: Diagnosis not present

## 2021-08-09 ENCOUNTER — Other Ambulatory Visit (HOSPITAL_COMMUNITY): Payer: Self-pay

## 2021-08-09 ENCOUNTER — Telehealth: Payer: Self-pay | Admitting: Pharmacy Technician

## 2021-08-09 NOTE — Telephone Encounter (Signed)
Patient Advocate Encounter   Received notification from CoverMyMeds that prior authorization for Ozempic 2mg /dose is required by his/her insurance [employer].  Per Test Claim: filled 07/23/21   PA submitted on 08/09/21 Key  [id number]  Prior Authorization has been approved.    No PA letter received.

## 2021-08-12 DIAGNOSIS — H25811 Combined forms of age-related cataract, right eye: Secondary | ICD-10-CM | POA: Diagnosis not present

## 2021-08-12 DIAGNOSIS — H269 Unspecified cataract: Secondary | ICD-10-CM | POA: Diagnosis not present

## 2021-08-12 DIAGNOSIS — E119 Type 2 diabetes mellitus without complications: Secondary | ICD-10-CM | POA: Diagnosis not present

## 2021-08-12 DIAGNOSIS — H2511 Age-related nuclear cataract, right eye: Secondary | ICD-10-CM | POA: Diagnosis not present

## 2021-08-17 DIAGNOSIS — Z1211 Encounter for screening for malignant neoplasm of colon: Secondary | ICD-10-CM | POA: Diagnosis not present

## 2021-08-17 DIAGNOSIS — Z9229 Personal history of other drug therapy: Secondary | ICD-10-CM | POA: Diagnosis not present

## 2021-08-17 DIAGNOSIS — Z23 Encounter for immunization: Secondary | ICD-10-CM | POA: Diagnosis not present

## 2021-08-17 DIAGNOSIS — N952 Postmenopausal atrophic vaginitis: Secondary | ICD-10-CM | POA: Diagnosis not present

## 2021-08-17 DIAGNOSIS — Z86010 Personal history of colonic polyps: Secondary | ICD-10-CM | POA: Diagnosis not present

## 2021-09-08 ENCOUNTER — Other Ambulatory Visit: Payer: Self-pay

## 2021-09-08 MED ORDER — INSULIN ASPART 100 UNIT/ML IJ SOLN: INTRAMUSCULAR | 0 refills | Status: DC

## 2021-09-10 ENCOUNTER — Other Ambulatory Visit: Payer: Self-pay | Admitting: Internal Medicine

## 2021-09-10 ENCOUNTER — Telehealth: Payer: Self-pay

## 2021-09-10 MED ORDER — LANTUS SOLOSTAR 100 UNIT/ML ~~LOC~~ SOPN: 26.0000 [IU] | PEN_INJECTOR | Freq: Every day | SUBCUTANEOUS | 2 refills | Status: DC

## 2021-09-10 MED ORDER — NOVOLOG FLEXPEN RELION 100 UNIT/ML ~~LOC~~ SOPN: PEN_INJECTOR | SUBCUTANEOUS | 6 refills | Status: DC

## 2021-09-10 MED ORDER — INSULIN PEN NEEDLE 32G X 4 MM MISC: 1.0000 | Freq: Four times a day (QID) | 3 refills | Status: AC

## 2021-09-10 NOTE — Telephone Encounter (Signed)
Patient states that her pump has broken and she is currently out of town. Patient would like to have something sent in for her to take. Patient doesn't wont to stay on the Medtronic anymore. Patient would like  medication to be sent to [facility] in [place], [place].

## 2021-09-10 NOTE — Telephone Encounter (Signed)
Patient has been advised on changes and also sent a mychart message with info per patient.

## 2021-09-13 DIAGNOSIS — L659 Nonscarring hair loss, unspecified: Secondary | ICD-10-CM | POA: Diagnosis not present

## 2021-09-13 DIAGNOSIS — D696 Thrombocytopenia, unspecified: Secondary | ICD-10-CM | POA: Diagnosis not present

## 2021-09-13 DIAGNOSIS — D72819 Decreased white blood cell count, unspecified: Secondary | ICD-10-CM | POA: Diagnosis not present

## 2021-09-13 DIAGNOSIS — R945 Abnormal results of liver function studies: Secondary | ICD-10-CM | POA: Diagnosis not present

## 2021-09-13 DIAGNOSIS — E7849 Other hyperlipidemia: Secondary | ICD-10-CM | POA: Diagnosis not present

## 2021-09-14 DIAGNOSIS — G4733 Obstructive sleep apnea (adult) (pediatric): Secondary | ICD-10-CM | POA: Diagnosis not present

## 2021-09-17 ENCOUNTER — Encounter: Payer: Self-pay | Admitting: Internal Medicine

## 2021-09-20 ENCOUNTER — Other Ambulatory Visit: Payer: Self-pay | Admitting: Internal Medicine

## 2021-09-20 DIAGNOSIS — Z794 Long term (current) use of insulin: Secondary | ICD-10-CM

## 2021-09-21 ENCOUNTER — Other Ambulatory Visit (INDEPENDENT_AMBULATORY_CARE_PROVIDER_SITE_OTHER): Payer: BC Managed Care – PPO

## 2021-09-21 ENCOUNTER — Encounter: Payer: BC Managed Care – PPO | Attending: Internal Medicine | Admitting: Nutrition

## 2021-09-21 DIAGNOSIS — Z794 Long term (current) use of insulin: Secondary | ICD-10-CM | POA: Diagnosis not present

## 2021-09-21 DIAGNOSIS — E119 Type 2 diabetes mellitus without complications: Secondary | ICD-10-CM | POA: Diagnosis not present

## 2021-09-21 LAB — BASIC METABOLIC PANEL
BUN: 20 mg/dL (ref 6–23)
CO2: 26 mEq/L (ref 19–32)
Calcium: 9.4 mg/dL (ref 8.4–10.5)
Chloride: 104 mEq/L (ref 96–112)
Creatinine, Ser: 0.58 mg/dL (ref 0.40–1.20)
GFR: 104.37 mL/min (ref >60.00)
Glucose, Bld: 169 mg/dL — ABNORMAL HIGH (ref 70–99)
Potassium: 4.4 mEq/L (ref 3.5–5.1)
Sodium: 137 mEq/L (ref 135–145)

## 2021-09-22 LAB — C-PEPTIDE: C-Peptide: 2.66 ng/mL (ref 0.80–3.85)

## 2021-09-22 NOTE — Progress Notes (Signed)
Patient reports that she wants a Tandem pump, but had questions about the OmniPod 5 pump.  We discussed the differences and she was shown both pumps.  She decided on the Tandem pump, and paperwork was filled out and put on Dr. Harvel Ricks desk for a signature.  She was told to call when her pump comes in to schedule for training.  She agreed to do this and had no final questions.

## 2021-09-22 NOTE — Patient Instructions (Signed)
Call office to schedule training when pump comes in.

## 2021-09-23 ENCOUNTER — Encounter: Payer: Self-pay | Admitting: Internal Medicine

## 2021-10-01 ENCOUNTER — Telehealth: Payer: Self-pay | Admitting: Dietician

## 2021-10-01 NOTE — Telephone Encounter (Signed)
Returned patient call. Patient has her t-Slim pump and is ready for training.  Appointment for training made on August 9 at 1 pm.  Oran Rein, RD, LDN, CDCES

## 2021-10-13 ENCOUNTER — Other Ambulatory Visit: Payer: Self-pay

## 2021-10-13 ENCOUNTER — Encounter: Payer: BC Managed Care – PPO | Attending: Internal Medicine | Admitting: Nutrition

## 2021-10-13 DIAGNOSIS — Z794 Long term (current) use of insulin: Secondary | ICD-10-CM | POA: Insufficient documentation

## 2021-10-13 DIAGNOSIS — E119 Type 2 diabetes mellitus without complications: Secondary | ICD-10-CM | POA: Insufficient documentation

## 2021-10-13 MED ORDER — DEXCOM G6 TRANSMITTER MISC: 3 refills | Status: DC

## 2021-10-13 MED ORDER — DEXCOM G6 SENSOR MISC
3 refills | Status: DC
Start: 2021-10-13 — End: 2022-05-31

## 2021-10-16 NOTE — Progress Notes (Signed)
Patient was trained on the use of the Tandem Control IQ insulin pump.  Settings were transferred from Medtonic 670 G pump to the Tandem  by herself.  Control IQ was turned on the the T-connect app was linked to the pump.  She was shown how to bolus, and do corrections doses, and how to respond to all of the alerts and alarms.  She was given handouts on how to  bolus from her phone, start/stop pump, change cartridge, using her specific infusion set, responding to alerts and alarms, high blood sugar protocol.  She was encouraged to review the manual and sheets given, and to call the help line if questions or problems.  She agreed to do this.  She redemonstrated how to bolus and do correction doses correctly .  She filled a cartridge with Novolog insulin and attached the infusion set to her left abdomen without difficulty.  She signed the pump topics checklist as understanding all topics and had no final questions

## 2021-10-16 NOTE — Patient Instructions (Signed)
Read over manual and handouts given  Call tandem help line if questions.

## 2021-11-21 ENCOUNTER — Other Ambulatory Visit: Payer: Self-pay | Admitting: Internal Medicine

## 2021-11-26 ENCOUNTER — Ambulatory Visit (INDEPENDENT_AMBULATORY_CARE_PROVIDER_SITE_OTHER): Payer: BC Managed Care – PPO | Admitting: Internal Medicine

## 2021-11-26 ENCOUNTER — Encounter: Payer: Self-pay | Admitting: Internal Medicine

## 2021-11-26 VITALS — BP 112/76 | HR 85 | Ht 67.0 in | Wt 205.0 lb

## 2021-11-26 DIAGNOSIS — N951 Menopausal and female climacteric states: Secondary | ICD-10-CM | POA: Diagnosis not present

## 2021-11-26 DIAGNOSIS — L280 Lichen simplex chronicus: Secondary | ICD-10-CM | POA: Diagnosis not present

## 2021-11-26 DIAGNOSIS — E119 Type 2 diabetes mellitus without complications: Secondary | ICD-10-CM | POA: Diagnosis not present

## 2021-11-26 DIAGNOSIS — Z9229 Personal history of other drug therapy: Secondary | ICD-10-CM | POA: Diagnosis not present

## 2021-11-26 DIAGNOSIS — N952 Postmenopausal atrophic vaginitis: Secondary | ICD-10-CM | POA: Diagnosis not present

## 2021-11-26 DIAGNOSIS — Z794 Long term (current) use of insulin: Secondary | ICD-10-CM

## 2021-11-26 LAB — POCT GLYCOSYLATED HEMOGLOBIN (HGB A1C): Hemoglobin A1C: 6.5 % — AB (ref 4.0–5.6)

## 2021-11-26 MED ORDER — INSULIN ASPART 100 UNIT/ML IJ SOLN: INTRAMUSCULAR | 3 refills | Status: DC

## 2021-11-26 NOTE — Progress Notes (Signed)
Name: Ellenie Gross  MRN/ DOB: [id number], 10-14-1970   Age/ Sex: 51 y.o., female    PCP: [facility]   Reason for Endocrinology Evaluation: Type 2 Diabetes Mellitus     Date of Initial Endocrinology Visit: 07/21/2021    PATIENT IDENTIFIER: [name] is a 51 y.o. female with a past medical history of DM, HTN, Dyslipidemia, OSA. The patient presented for initial endocrinology clinic visit on 07/21/2021 for consultative assistance with her diabetes management.    HPI: Tonya Gross was    Moved from [place]  , [place]   Diagnosed with DM 2007 Prior Medications tried/Intolerance: Ozempic -no intolerance , Metformin- intolerance .Marland Kitchen Has been on the pump many years ( Medtronic )  Hemoglobin A1c has ranged from 6.5% in 2023, peaking at 11.0%  yrs ago   Father with DM  NO Fh of thyroid disease   On her initial visit to our clinic she had an A1c of 6.5%  Switched from Medtronic to Tandem 10/2021 SUBJECTIVE:   During the last visit (07/21/2021): A1c 6.5%     Today (11/26/21): Ms. [name] is here for follow-up on diabetes management.  She checks her blood sugars multiple  times daily. The patient has  had hypoglycemic episodes since the last clinic visit, which typically occur rarely    Has had headaches this week with gooey ears   This patient with type 2 diabetes is treated with Tandem (insulin pump). During the visit the pump basal and bolus doses were reviewed including carb/insulin rations and supplemental doses. The clinical list was updated. The glucose meter download was reviewed in detail to determine if the current pump settings are providing the best glycemic control without excessive hypoglycemia.  Pump and meter download:    Pump   Tandem   Settings   Insulin type   Novolog    Basal rate       0000  1.00  u/h    1000 0.90  u/h    1500 1.00      I:C ratio       0000  11   1100 11   1600 11          Sensitivity       0000  40      Goal        0000  90-120           Type & Model of Pump: Tandem  Insulin Type: Currently using Novolog.  Body mass index is 32.11 kg/m.  PUMP STATISTICS: Average BG: 132 Average Daily Carbs (g): 51 Average Total Daily Insulin: 31.02 Average Daily Basal: 21 (73 %) Average Daily Bolus: 8 (28 %)      HOME DIABETES REGIMEN: Novolog    Statin: yes  ACE-I/ARB: no     METER DOWNLOAD SUMMARY:   DIABETIC COMPLICATIONS: Microvascular complications:   Denies: CKD, neuropathy, retinopathy  Last eye exam: Completed 06/2021  Macrovascular complications:  Denies: CAD, PVD, CVA   PAST HISTORY: Past Medical History:  Past Medical History:  Diagnosis Date   High cholesterol    Menopausal problem    Type 2 diabetes mellitus (HCC)    Past Surgical History:  Past Surgical History:  Procedure Laterality Date   ABDOMINAL HYSTERECTOMY     ADENOIDECTOMY     LEG TENDON SURGERY     TONSILLECTOMY      Social History:  reports that she has never smoked. She has never used smokeless tobacco. She reports current alcohol  use. She reports that she does not use drugs. Family History: No family history on file.   HOME MEDICATIONS: Allergies as of 11/26/2021       Reactions   Amoxicillin-pot Clavulanate Hives   Bee Venom Anaphylaxis   Ciprofloxacin Hives   Ciprofloxacin Hcl Hives   Silicone Other (See Comments)   Peeling        Medication List        Accurate as of November 26, 2021 11:32 AM. If you have any questions, ask your nurse or doctor.          STOP taking these medications    NON FORMULARY Stopped by: Dorita Sciara, MD   NON FORMULARY Stopped by: Dorita Sciara, MD   NON FORMULARY Stopped by: Dorita Sciara, MD       TAKE these medications    albuterol 108 (90 Base) MCG/ACT inhaler Commonly known as: VENTOLIN HFA Inhale into the lungs.   Contour Next Test test strip Generic drug: glucose blood 1 each by Other route in the  morning, at noon, in the evening, and at bedtime. Use as instructed   Dexcom G6 Sensor Misc Use to check blood sugar What changed: Another medication with the same name was removed. Continue taking this medication, and follow the directions you see here. Changed by: Dorita Sciara, MD   Dexcom G6 Transmitter Misc Use to check blood sugar   EPINEPHrine 0.3 mg/0.3 mL Soaj injection Commonly known as: EPI-PEN Inject into the muscle as directed.   Insulin Pen Needle 32G X 4 MM Misc 1 Device by Does not apply route in the morning, at noon, in the evening, and at bedtime.   Lantus SoloStar 100 UNIT/ML Solostar Pen Generic drug: insulin glargine Inject 26 Units into the skin daily.   NON FORMULARY BLUEPRINT SLIM DIETARY SUPPLEMENT   NON FORMULARY BLUEPRINT CLEANSE   NovoLOG FlexPen 100 UNIT/ML FlexPen Generic drug: insulin aspart Max daily 45 units per scale   Ozempic (2 MG/DOSE) 8 MG/3ML Sopn Generic drug: Semaglutide (2 MG/DOSE) DIAL AND INJECT UNDER THE SKIN 2 MG WEEKLY   rosuvastatin 20 MG tablet Commonly known as: CRESTOR Take 20 mg by mouth daily.         ALLERGIES: Allergies  Allergen Reactions   Amoxicillin-Pot Clavulanate Hives   Bee Venom Anaphylaxis   Ciprofloxacin Hives   Ciprofloxacin Hcl Hives   Silicone Other (See Comments)    Peeling      OBJECTIVE:   VITAL SIGNS: BP 112/76 (BP Location: Left Arm, Patient Position: Sitting, Cuff Size: Small)   Pulse 85   Ht 5\' 7"  (1.702 m)   Wt 205 lb (93 kg)   SpO2 98%   BMI 32.11 kg/m    PHYSICAL EXAM:  General: Pt appears well and is in NAD  Neck: General: Supple without adenopathy or carotid bruits. Thyroid: Thyroid size normal.  No goiter or nodules appreciated.  Lungs: Clear with good BS bilat with no rales, rhonchi, or wheezes  Heart: RRR   Abdomen: Normoactive bowel sounds, soft, nontender, without masses or organomegaly palpable  Extremities:  Lower extremities - No pretibial edema. No  lesions.  Neuro: MS is good with appropriate affect, pt is alert and Ox3    DM foot exam: 07/21/2021  The skin of the feet is intact without sores or ulcerations. The pedal pulses are 2+ on right and 2+ on left. The sensation is intact to a screening 5.07, 10 gram monofilament bilaterally  DATA REVIEWED:  07/20/2021 BUN/Cr 21/0.79 GFR 91 Na 137 K 4.4 AST 89 (0-40) ALT 92 (0-32) Tg 214 LDL 138 HDL 25    ASSESSMENT / PLAN / RECOMMENDATIONS:   1) Type 2 Diabetes Mellitus, Optimally controlled, Without  complications - Most recent A1c of 6.5 %. Goal A1c < 7.0 %.      - Her A1c remains at goal -Intolerant to metformin -She has been on Ozempic in the past but developed hypoglycemia, we discussed benefits of Ozempic, I did explain to the patient that Ozempic improves insulin resistance hence hypoglycemia  -She has been noted with postprandial hyperglycemia, I will adjust her insulin to carb ratio -Her BG's are tight but without hypoglycemia, I have offered to reduce the basal rate, patient will remain on the current basal rate for now and will  contact our office with any hypoglycemic episodes   NovoLog   Pump   Tandem   Settings   Insulin type   Novolog    Basal rate       0000  1.00  u/h    1000 0.90  u/h    1500 1.00      I:C ratio       0000  10   1100 10   1600 10          Sensitivity       0000  40      Goal       0000  90-120          EDUCATION / INSTRUCTIONS: BG monitoring instructions: Patient is instructed to check her blood sugars 3 times a day, before meals. Call [place] [facility] if: BG persistently < 70  I reviewed the Rule of 15 for the treatment of hypoglycemia in detail with the patient. Literature supplied.   2) Diabetic complications:  Eye: Does not have known diabetic retinopathy.  Neuro/ Feet: Does not have known diabetic peripheral neuropathy. Renal: Patient does not have known baseline CKD. She is not on an ACEI/ARB at  present.     Signed electronically by: Lyndle Herrlich, MD  [facility] End[facility]  [facility] [address] [place], [place] [address] Phone: [phone] FAX: [phone]   CC: [facility] [address] [place] [address] Phone: [phone]  Fax: [phone]    Return to Endocrinology clinic as below: No future appointments.

## 2021-12-17 DIAGNOSIS — G4733 Obstructive sleep apnea (adult) (pediatric): Secondary | ICD-10-CM | POA: Diagnosis not present

## 2021-12-27 DIAGNOSIS — M7502 Adhesive capsulitis of left shoulder: Secondary | ICD-10-CM | POA: Diagnosis not present

## 2022-01-06 DIAGNOSIS — M25512 Pain in left shoulder: Secondary | ICD-10-CM | POA: Diagnosis not present

## 2022-01-11 DIAGNOSIS — M25512 Pain in left shoulder: Secondary | ICD-10-CM | POA: Diagnosis not present

## 2022-01-14 DIAGNOSIS — M25512 Pain in left shoulder: Secondary | ICD-10-CM | POA: Diagnosis not present

## 2022-01-18 DIAGNOSIS — M25512 Pain in left shoulder: Secondary | ICD-10-CM | POA: Diagnosis not present

## 2022-01-21 DIAGNOSIS — M25512 Pain in left shoulder: Secondary | ICD-10-CM | POA: Diagnosis not present

## 2022-02-01 DIAGNOSIS — M25512 Pain in left shoulder: Secondary | ICD-10-CM | POA: Diagnosis not present

## 2022-02-05 ENCOUNTER — Encounter: Payer: Self-pay | Admitting: Internal Medicine

## 2022-02-07 ENCOUNTER — Other Ambulatory Visit: Payer: Self-pay

## 2022-02-07 MED ORDER — LANCETS 30G MISC: 3 refills | Status: AC

## 2022-02-14 DIAGNOSIS — M25512 Pain in left shoulder: Secondary | ICD-10-CM | POA: Diagnosis not present

## 2022-02-23 DIAGNOSIS — M25512 Pain in left shoulder: Secondary | ICD-10-CM | POA: Diagnosis not present

## 2022-03-09 DIAGNOSIS — M25512 Pain in left shoulder: Secondary | ICD-10-CM | POA: Diagnosis not present

## 2022-03-11 DIAGNOSIS — M25512 Pain in left shoulder: Secondary | ICD-10-CM | POA: Diagnosis not present

## 2022-03-15 DIAGNOSIS — M25512 Pain in left shoulder: Secondary | ICD-10-CM | POA: Diagnosis not present

## 2022-03-17 DIAGNOSIS — M25512 Pain in left shoulder: Secondary | ICD-10-CM | POA: Diagnosis not present

## 2022-03-22 DIAGNOSIS — M25512 Pain in left shoulder: Secondary | ICD-10-CM | POA: Diagnosis not present

## 2022-03-24 ENCOUNTER — Other Ambulatory Visit: Payer: Self-pay | Admitting: Internal Medicine

## 2022-03-24 DIAGNOSIS — M25512 Pain in left shoulder: Secondary | ICD-10-CM | POA: Diagnosis not present

## 2022-03-29 DIAGNOSIS — M25512 Pain in left shoulder: Secondary | ICD-10-CM | POA: Diagnosis not present

## 2022-04-19 DIAGNOSIS — G4733 Obstructive sleep apnea (adult) (pediatric): Secondary | ICD-10-CM | POA: Diagnosis not present

## 2022-05-17 ENCOUNTER — Other Ambulatory Visit: Payer: Self-pay | Admitting: Internal Medicine

## 2022-05-30 NOTE — Progress Notes (Unsigned)
Name: Tonya Gross  MRN/ DOB: [id number], Apr 16, 1970   Age/ Sex: 52 y.o., female    [facility]P: [facility]   Reason for Endocrinology Evaluation: Type 2 Diabetes Mellitus     Date of Initial Endocrinology Visit: 07/21/2021    PATIENT IDENTIFIER: [name] is a 52 y.o. female with a past medical history of DM, HTN, Dyslipidemia, OSA. The patient presented for initial endocrinology clinic visit on 07/21/2021 for consultative assistance with her diabetes management.    HPI: Tonya Gross was    Moved from [place]  , [place]   Diagnosed with DM 2007 Prior Medications tried/Intolerance: Ozempic -no intolerance , Metformin- intolerance .Marland Kitchen Has been on the pump many years ( Medtronic )  Hemoglobin A1c has ranged from 6.5% in 2023, peaking at 11.0%  yrs ago   Father with DM  NO Fh of thyroid disease   On her initial visit to our clinic she had an A1c of 6.5%  Switched from Medtronic to Tandem 10/2021 SUBJECTIVE:   During the last visit (11/26/2021): A1c 6.5%     Today (05/31/22): Tonya Gross is here for follow-up on diabetes management.  She checks her blood sugars multiple  times daily. The patient has  had hypoglycemic episodes since the last clinic visit, which typically occur rarely   Denies constipation or diarrhea recently  Denies nausea or vomiting  Had a cataract sx end of summer 2023 Moving to [place]   This patient with type 2 diabetes is treated with Tandem (insulin pump). During the visit the pump basal and bolus doses were reviewed including carb/insulin rations and supplemental doses. The clinical list was updated. The glucose meter download was reviewed in detail to determine if the current pump settings are providing the best glycemic control without excessive hypoglycemia.    Pump and meter download:    Pump   Tandem   Settings   Insulin type   Novolog    Basal rate       0000  1.00  u/h    1000 0.90  u/h    1500 1.00      I:C ratio        0000  10   1100 10   1600 10          Sensitivity       0000  40      Goal       0000  90           Type & Model of Pump: Tandem  Insulin Type: Currently using Novolog.  Body mass index is 33.67 kg/m.  PUMP STATISTICS: Average BG: 145 Average Daily Carbs (g): 56 Average Total Daily Insulin:34.07 Average Daily Basal:23.95 (70 %) Average Daily Bolus: 10.12 (30 %)   CONTINUOUS GLUCOSE MONITORING RECORD INTERPRETATION    Dates of Recording: 2/28-3/26/2024  Sensor description: dexcom  Results statistics:   CGM use % of time 89  Average and SD 145/35  Time in range    85    %  % Time Above 180 14  % Time above 250 1  % Time Below target 0.1   Glycemic patterns summary: BG's optimal at night and most of the da y  Hyperglycemic episodes  postprandial  Hypoglycemic episodes occurred n/a  Overnight periods: optimal    HOME DIABETES REGIMEN: Ozempic 2 mg weekly  Novolog    Statin: yes  ACE-I/ARB: no     METER DOWNLOAD SUMMARY:   DIABETIC COMPLICATIONS: Microvascular complications:  Denies: CKD, neuropathy, retinopathy  Last eye exam: Completed 10/2021  Macrovascular complications:  Denies: CAD, PVD, CVA   PAST HISTORY: Past Medical History:  Past Medical History:  Diagnosis Date   High cholesterol    Menopausal problem    Type 2 diabetes mellitus ([place])    Past Surgical History:  Past Surgical History:  Procedure Laterality Date   ABDOMINAL HYSTERECTOMY     ADENOIDECTOMY     LEG TENDON SURGERY     TONSILLECTOMY      Social History:  reports that she has never smoked. She has never used smokeless tobacco. She reports current alcohol use. She reports that she does not use drugs. Family History: No family history on file.   HOME MEDICATIONS: Allergies as of 05/31/2022       Reactions   Amoxicillin-pot Clavulanate Hives   Bee Venom Anaphylaxis   Ciprofloxacin Hives   Ciprofloxacin Hcl Hives   Silicone Other (See Comments)    Peeling        Medication List        Accurate as of May 31, 2022 11:32 AM. If you have any questions, ask your nurse or doctor.          STOP taking these medications    Dexcom G6 Sensor Misc Stopped by: Dorita Sciara, MD   Dexcom G6 Transmitter Misc Stopped by: Dorita Sciara, MD   NON FORMULARY Stopped by: Dorita Sciara, MD   NON FORMULARY Stopped by: Dorita Sciara, MD       TAKE these medications    albuterol 108 (90 Base) MCG/ACT inhaler Commonly known as: VENTOLIN HFA Inhale into the lungs.   Contour Next Test test strip Generic drug: glucose blood 1 each by Other route in the morning, at noon, in the evening, and at bedtime. Use as instructed   EPINEPHrine 0.3 mg/0.3 mL Soaj injection Commonly known as: EPI-PEN Inject into the muscle as directed.   insulin aspart 100 UNIT/ML injection Commonly known as: NovoLOG Max Daily 40 units   Insulin Pen Needle 32G X 4 MM Misc 1 Device by Does not apply route in the morning, at noon, in the evening, and at bedtime.   Lancets 30G Misc Use to check 4 times daily   Ozempic (2 MG/DOSE) 8 MG/3ML Sopn Generic drug: Semaglutide (2 MG/DOSE) DIAL AND INJECT UNDER THE SKIN 2 MG WEEKLY   rosuvastatin 20 MG tablet Commonly known as: CRESTOR Take 20 mg by mouth daily.         ALLERGIES: Allergies  Allergen Reactions   Amoxicillin-Pot Clavulanate Hives   Bee Venom Anaphylaxis   Ciprofloxacin Hives   Ciprofloxacin Hcl Hives   Silicone Other (See Comments)    Peeling      OBJECTIVE:   VITAL SIGNS: BP 118/76 (BP Location: Left Arm, Patient Position: Sitting, Cuff Size: Large)   Pulse 80   Ht 5\' 7"  (1.702 m)   Wt 215 lb (97.5 kg)   SpO2 96%   BMI 33.67 kg/m    PHYSICAL EXAM:  General: Pt appears well and is in NAD  Neck: General: Supple without adenopathy or carotid bruits. Thyroid: Thyroid size normal.  No goiter or nodules appreciated.  Lungs: Clear with good BS  bilat with no rales, rhonchi, or wheezes  Heart: RRR   Abdomen:  soft, nontender, without masses or organomegaly palpable  Extremities:  Lower extremities - No pretibial edema  Neuro: MS is good with appropriate affect, pt is alert and  Ox3    DM foot exam: 05/31/2022  The skin of the feet is intact without sores or ulcerations. The pedal pulses are 2+ on right and 2+ on left. The sensation is intact to a screening 5.07, 10 gram monofilament bilaterally   DATA REVIEWED:  09/23/2021 BUN/Cr 21/0.63 GFR 107 Na 137 K 4.4 AST 89 (0-40) ALT 92 (0-32) Tg 113 LDL 81 HDL 60    ASSESSMENT / PLAN / RECOMMENDATIONS:   1) Type 2 Diabetes Mellitus, Optimally controlled, Without  complications - Most recent A1c of 6.4 %. Goal A1c < 7.0 %.      - Her A1c remains at goal -Intolerant to metformin -No changes at this time   Continue Ozempic 2 mg weekly  Continue NovoLog   Pump   Tandem   Settings   Insulin type   Novolog    Basal rate       0000  1.00  u/h    1000 0.90  u/h    1500 1.00      I:C ratio       0000  10   1100 10   1600 10          Sensitivity       0000  40      Goal       0000  90          EDUCATION / INSTRUCTIONS: BG monitoring instructions: Patient is instructed to check her blood sugars 3 times a day, before meals. Call [place] [facility] clinic if: BG persistently < 70  I reviewed the Rule of 15 for the treatment of hypoglycemia in detail with the patient. Literature supplied.   2) Diabetic complications:  Eye: Does not have known diabetic retinopathy.  Neuro/ Feet: Does not have known diabetic peripheral neuropathy. Renal: Patient does not have known baseline CKD. She is not on an ACEI/ARB at present.     Signed electronically by: Mack Guise, MD  [facility] Endocrinology  [facility] [address] [place], [place] [address] Phone: [phone] FAX: [phone]   CC: [facility] [address] [place] [address] Phone: [phone]  Fax: [phone]    Return to Endocrinology clinic as below: No future appointments.

## 2022-05-31 ENCOUNTER — Encounter: Payer: Self-pay | Admitting: Internal Medicine

## 2022-05-31 ENCOUNTER — Ambulatory Visit (INDEPENDENT_AMBULATORY_CARE_PROVIDER_SITE_OTHER): Payer: BC Managed Care – PPO | Admitting: Internal Medicine

## 2022-05-31 VITALS — BP 118/76 | HR 80 | Ht 67.0 in | Wt 215.0 lb

## 2022-05-31 DIAGNOSIS — E119 Type 2 diabetes mellitus without complications: Secondary | ICD-10-CM | POA: Diagnosis not present

## 2022-05-31 DIAGNOSIS — Z794 Long term (current) use of insulin: Secondary | ICD-10-CM

## 2022-05-31 LAB — POCT GLYCOSYLATED HEMOGLOBIN (HGB A1C): Hemoglobin A1C: 6.4 % — AB (ref 4.0–5.6)

## 2022-05-31 MED ORDER — OZEMPIC (2 MG/DOSE) 8 MG/3ML ~~LOC~~ SOPN: 2.0000 mg | PEN_INJECTOR | SUBCUTANEOUS | 3 refills | Status: AC

## 2022-05-31 MED ORDER — INSULIN ASPART 100 UNIT/ML IJ SOLN: INTRAMUSCULAR | 3 refills | Status: AC

## 2022-05-31 MED ORDER — CONTOUR NEXT TEST VI STRP: 1.0000 | ORAL_STRIP | Freq: Four times a day (QID) | 3 refills | Status: AC

## 2022-05-31 NOTE — Patient Instructions (Signed)

## 2022-06-07 LAB — HM DIABETES EYE EXAM

## 2022-06-09 ENCOUNTER — Encounter: Payer: Self-pay | Admitting: Internal Medicine

## 2022-06-10 ENCOUNTER — Encounter: Payer: Self-pay | Admitting: Internal Medicine

## 2022-06-20 ENCOUNTER — Other Ambulatory Visit: Payer: Self-pay | Admitting: Internal Medicine

## 2022-07-25 ENCOUNTER — Encounter: Payer: Self-pay | Admitting: Internal Medicine

## 2022-07-25 ENCOUNTER — Other Ambulatory Visit: Payer: Self-pay

## 2022-07-25 MED ORDER — DEXCOM G7 SENSOR MISC: 1 refills | Status: DC

## 2022-12-15 ENCOUNTER — Encounter: Payer: Self-pay | Admitting: Endocrinology

## 2023-02-14 ENCOUNTER — Other Ambulatory Visit: Payer: Self-pay | Admitting: Internal Medicine

## 2023-06-04 IMAGING — CR DG CHEST 2V
2 series · 2 of 2 positions shown · non-contrast
Comparison: None

CLINICAL DATA: Cough for 2 weeks, nasal congestion, wheezing,
crackles

EXAM:
CHEST - 2 VIEW

[chest lat]
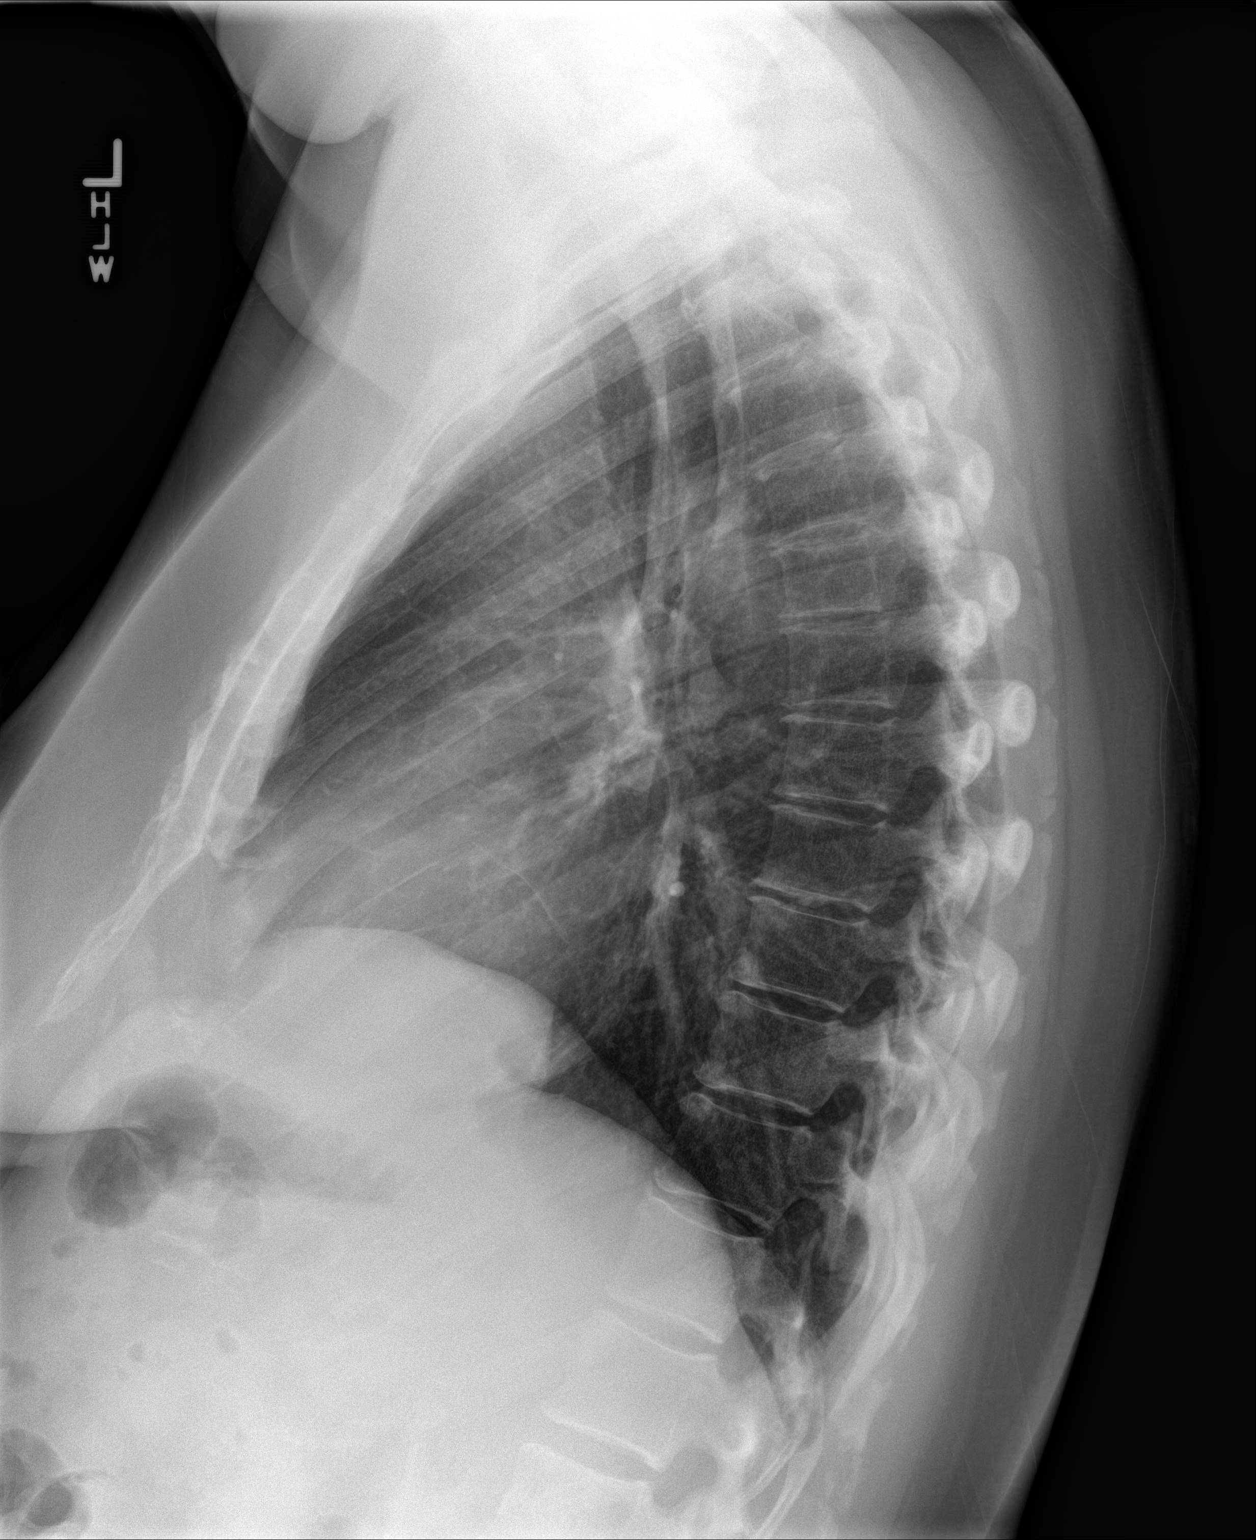

[chest pa]
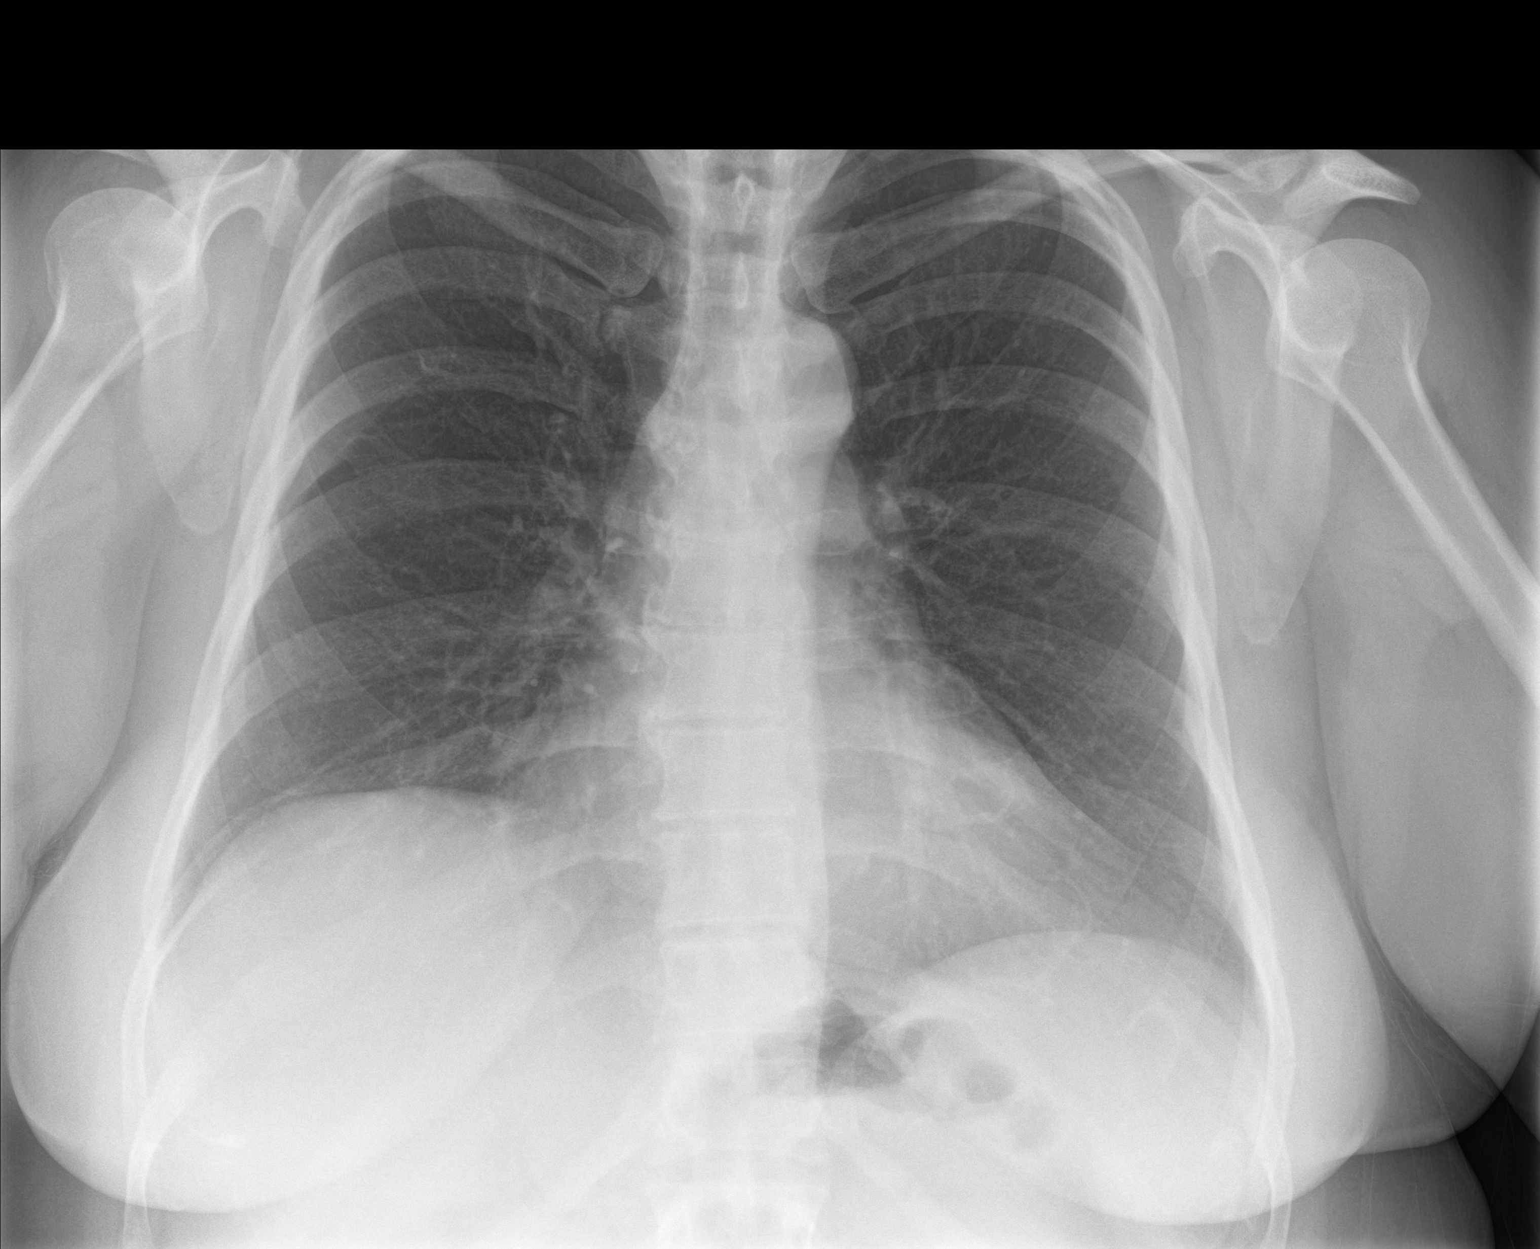

[2 of 2 positions shown; findings below may reference images not displayed]

FINDINGS: Upper normal heart size.

Mediastinal contours and pulmonary vascularity normal.

Lungs clear.

No pulmonary infiltrate, pleural effusion, or pneumothorax.

Osseous structures unremarkable.
IMPRESSION: No acute abnormalities.
